# Patient Record
Sex: Female | Born: 1975
Health system: Southern US, Community
[De-identification: ages and names within clinical notes are randomized; demographics above are authoritative.]

## PROBLEM LIST (undated history)

## (undated) DIAGNOSIS — F419 Anxiety disorder, unspecified: Secondary | ICD-10-CM

## (undated) DIAGNOSIS — F988 Other specified behavioral and emotional disorders with onset usually occurring in childhood and adolescence: Secondary | ICD-10-CM

## (undated) DIAGNOSIS — A64 Unspecified sexually transmitted disease: Secondary | ICD-10-CM

## (undated) DIAGNOSIS — R7611 Nonspecific reaction to tuberculin skin test without active tuberculosis: Secondary | ICD-10-CM

## (undated) DIAGNOSIS — E7800 Pure hypercholesterolemia, unspecified: Secondary | ICD-10-CM

## (undated) DIAGNOSIS — E559 Vitamin D deficiency, unspecified: Secondary | ICD-10-CM

## (undated) HISTORY — DX: Anxiety disorder, unspecified: F41.9

## (undated) HISTORY — PX: OTHER SURGICAL HISTORY: SHX169

## (undated) HISTORY — DX: Other specified behavioral and emotional disorders with onset usually occurring in childhood and adolescence: F98.8

## (undated) HISTORY — PX: LIPOSUCTION: SHX10

## (undated) HISTORY — DX: Pure hypercholesterolemia, unspecified: E78.00

## (undated) HISTORY — DX: Unspecified sexually transmitted disease: A64

## (undated) HISTORY — DX: Vitamin D deficiency, unspecified: E55.9

## (undated) HISTORY — DX: Nonspecific reaction to tuberculin skin test without active tuberculosis: R76.11

---

## 2008-07-19 ENCOUNTER — Ambulatory Visit: Payer: Self-pay | Admitting: Internal Medicine

## 2008-07-19 ENCOUNTER — Ambulatory Visit: Payer: Self-pay | Admitting: Pulmonary Disease

## 2008-08-15 ENCOUNTER — Ambulatory Visit: Payer: Self-pay | Admitting: Internal Medicine

## 2008-09-14 ENCOUNTER — Ambulatory Visit: Payer: Self-pay | Admitting: Adult Health

## 2008-11-19 ENCOUNTER — Telehealth: Payer: Self-pay | Admitting: Pulmonary Disease

## 2009-09-19 ENCOUNTER — Telehealth: Payer: Self-pay | Admitting: Pulmonary Disease

## 2009-12-01 ENCOUNTER — Ambulatory Visit: Payer: Self-pay | Admitting: Internal Medicine

## 2009-12-01 DIAGNOSIS — S6710XA Crushing injury of unspecified finger(s), initial encounter: Secondary | ICD-10-CM | POA: Insufficient documentation

## 2010-06-12 ENCOUNTER — Encounter: Payer: Self-pay | Admitting: Adult Health

## 2010-07-23 ENCOUNTER — Encounter: Payer: Self-pay | Admitting: Adult Health

## 2010-08-17 ENCOUNTER — Telehealth: Payer: Self-pay | Admitting: Pulmonary Disease

## 2010-11-13 NOTE — Assessment & Plan Note (Signed)
Summary: NEW PT/ AS PER DR/RCD   Vital Signs:  Patient profile:   35 year old female Height:      67.5 inches Weight:      176 pounds BMI:     27.26 Temp:     98.1 degrees F oral BP sitting:   98 / 82  (left arm) Cuff size:   regular  Vitals Entered By: Alfred Levins, CMA (December 01, 2009 3:33 PM) CC: smashed rt index finger in car door   History of Present Illness: Jackie Davis    come  sin today for   as an acute work in  first time visit for an acute injury that  happened this pm when smashed  tip of left  index finger in car door. Hurt very badly and she was with her young child .  Pain is a bit better now.   No previous injury . can move finger but feels different.  no oozing  .Marland Kitchen    On no meds   or blood thinners.  just vitamins.   Preventive Screening-Counseling & Management  Alcohol-Tobacco     Alcohol drinks/day: 0     Smoking Status: quit > 6 months  Caffeine-Diet-Exercise     Does Patient Exercise: yes      Drug Use:  no.    Current Medications (verified): 1)  None  Allergies (verified): No Known Drug Allergies  Past History:  Past Medical History: ADD- controlled on rx Positive PPD Childbirth   Past Surgical History: Liposuction  Family History: diabetes and heart disease in father mother is healthy 1 healthy sibling Family History Diabetes 1st degree relative Family History Hypertension  Social History: married  hhof  3  1 child former smoker Alcohol use-no Drug use-no Regular exercise-yes Masters education  Spouse Dr Marchelle Gearing.   Drug Use:  no Does Patient Exercise:  yes Smoking Status:  quit > 6 months  Review of Systems  The patient denies anorexia, fever, and abnormal bleeding.    Physical Exam  General:  Well-developed,well-nourished,in no acute distress; alert,appropriate and cooperative throughout examination Head:  normocephalic.   Eyes:  vision grossly intact.   Msk:  left index finger with redness and  tenderness at radial nail bed   but nl cap refill and no nail elevation.  finger pul is not tense or deformed .   dip jt slighlty tender but mobile.    NV seems intact  sug ungual hematoma about 1/4 to 1/3 of nail  Pulses:  nl cap refill  Extremities:  nl perfusion Neurologic:  nl gait an station Skin:  turgor normal and color normal.   except see finger exam. Psych:  Oriented X3 and good eye contact.     Impression & Recommendations:  Problem # 1:  CRUSHING INJURY, FINGER (ICD-927.3) index     small hematoma   not large enough   that evacuation would help.   R/O tuft fracture  ( prob not but is  late friday  before weekend so check this today.    Elevation cold and pain control.    if increasing swelling or bleeding  consider recheck sat clinic  .   Orders: T-Finger(s) (73140TC)  Complete Medication List: 1)  Ibuprofen 800 Mg Tabs (Ibuprofen) .Marland Kitchen.. 1 by mouth q 6-8 hours  as needed pain.  Patient Instructions: 1)  get x ray to r/o tuft fracture    2)  otherwise  cold compresses and elevation. 3)  if increasing  swelling  and bleeding  may need to be seen in sat clinic.   Prescriptions: IBUPROFEN 800 MG TABS (IBUPROFEN) 1 by mouth q 6-8 hours  as needed pain.  #40 x 1   Entered and Authorized by:   Madelin Headings MD   Signed by:   Madelin Headings MD on 12/01/2009   Method used:   Electronically to        [facility]* (retail)       [address]       [address] Shipping/mailing       [place], [place]  [address]       Ph: [phone]       Fax: [phone]   RxID:   [id number]  x ray negative and notified pt of this  Madelin Headings MD  December 01, 2009 5:47 PM   Because of  lateness of the hour did not address  tetanus immunization status.  Injury is closed type.

## 2010-11-13 NOTE — Letter (Signed)
Summary: [facility]  [address]   [place], [place] [address]   Phone: [phone]  Fax: [phone]    06/12/2010  Jackie Davis [address] [place], [place]  [address]  Dear Ms. [name],           Sincerely,   Rubye Oaks NP

## 2010-11-13 NOTE — Letter (Signed)
Summary: [facility]  [address]   [place], [place] [address]   Phone: [phone]  Fax: [phone]    06/12/2010  Jackie Davis [address] [place], [place]  [address]  To whom it may concern:   Regarding the above patient, Jackie Davis, is my patient at [facility].  She is having prolonged difficulty with knee/joint pain. Prolonged recovery is limiting her ability to exercise. She will be unable to exercise for an undertermined amount of time.          Sincerely,   Rubye Oaks NP [facility]

## 2010-11-13 NOTE — Progress Notes (Signed)
Summary: sore throat-give doxycycline  Phone Note Call from Patient   Caller: Spouse Call For: [place] Summary of Call: Pt c/o Sore throat,pharyngitis, fever x few days. Pt has responded to doxycycline well in the past.  per Sn ok to prescribe doxycycline 100mg  twice daily x 5 days with 3 refills. Pt husband is a physician and monitoring closely.  send to [facility] [phone].   rx sent. pt aware.  Initial call taken by: Carron Curie CMA,  August 17, 2010 4:27 PM    New/Updated Medications: DOXYCYCLINE HYCLATE 100 MG TABS (DOXYCYCLINE HYCLATE) Take 1 tablet by mouth two times a day x5 days Prescriptions: DOXYCYCLINE HYCLATE 100 MG TABS (DOXYCYCLINE HYCLATE) Take 1 tablet by mouth two times a day x5 days  #10 x 3   Entered by:   Carron Curie CMA   Authorized by:   Michele Mcalpine MD   Signed by:   Carron Curie CMA on 08/17/2010   Method used:   Historical   RxID:   [id number]

## 2010-11-15 NOTE — Letter (Signed)
Summary: [facility]  [facility]   Imported By: Lennie Odor 10/30/2010 12:04:31  _____________________________________________________________________  External Attachment:    Type:   Image     Comment:   External Document

## 2011-03-01 ENCOUNTER — Encounter: Payer: Self-pay | Admitting: Internal Medicine

## 2011-03-01 ENCOUNTER — Institutional Professional Consult (permissible substitution): Payer: Self-pay | Admitting: Internal Medicine

## 2011-03-26 ENCOUNTER — Institutional Professional Consult (permissible substitution): Payer: Self-pay | Admitting: Internal Medicine

## 2013-02-12 ENCOUNTER — Ambulatory Visit (INDEPENDENT_AMBULATORY_CARE_PROVIDER_SITE_OTHER): Payer: Self-pay

## 2013-02-12 DIAGNOSIS — R0609 Other forms of dyspnea: Secondary | ICD-10-CM

## 2013-02-12 DIAGNOSIS — R0600 Dyspnea, unspecified: Secondary | ICD-10-CM

## 2013-02-12 NOTE — Progress Notes (Signed)
Spiro done.

## 2013-02-15 ENCOUNTER — Ambulatory Visit: Payer: Self-pay

## 2013-11-18 ENCOUNTER — Telehealth: Payer: Self-pay | Admitting: Internal Medicine

## 2013-11-18 NOTE — Telephone Encounter (Signed)
Dr Colletta Marylandamaswami, Absolutely! Can she come for a 3 pm appt on 02/10? If not, on 02/12 at 4:15 pm? If not, we can schedule her later, too. I can add her to the schedule. Please let me know.  Silvestre Mesiristina

## 2013-11-18 NOTE — Telephone Encounter (Signed)
Gardenia PhlegmHi Wandra,  Our daughter is at school. Her ideal time is 9am and ending by 2.30pm. First available is fine. However, if you only have PM slots is it ok if our 7 year ild daughter comes along?  Thanks  Arielis Leonhart  Dr. Kalman ShanMurali Martia Dalby, M.D., F.C.C.P Pulmonary and Critical Care Medicine Staff Physician [facility] [place] [facility] and Critical Care Pager: [phone], If no answer or between  15:00h - 7:00h: call [phone]  11/18/2013 7:30 PM

## 2013-11-18 NOTE — Telephone Encounter (Signed)
Dear DR Gherghe  My wife wants a consultation with you regardiElvera Lennoxng weight management. How may she proceed to get an appointment? She has [facility] and I do not think she needs a referral  Thanks   Dr. Kalman ShanMurali Cecil Vandyke, M.D., [facility] CenterF.C.C.P Pulmonary and Critical Care Medicine Staff Physician [facility] [place] Pulmonary and Critical Care Pager: [phone], If no answer or between  15:00h - 7:00h: call [phone]  11/18/2013 2:47 PM

## 2013-11-19 NOTE — Telephone Encounter (Signed)
Dear Carmin MuskratMurali, We can do 11:15 am on 02/16. That would be first available in am. However, she can definitely bring your daughter along if she decides to stick with the pm appt. Silvestre Mesiristina

## 2013-11-22 NOTE — Telephone Encounter (Signed)
Thanks a lot 

## 2013-11-22 NOTE — Telephone Encounter (Signed)
Dear Jackie Davis, We put your wife on the list for next Monday at 11:15 am. Please tell her to come at or a little before 11 am. Thank you, Silvestre Mesiristina

## 2013-11-22 NOTE — Telephone Encounter (Signed)
Thanks Jackie MesiCristina  My wife Thompson GrayerCristina Ruas de Melo  Can come 11.15 am on 11/29/13. Please let us know location. I will have her come 15 minutes early  Thanks  Dr. Kalman ShanMurali Joliana Davis, M.D., [facility] Hospital[facility].C.C.P Pulmonary and Critical Care Medicine Staff Physician [facility] [place] Pulmonary and Critical Care Pager: [phone], If no answer or between  15:00h - 7:00h: call [phone]  11/22/2013 10:22 AM

## 2013-11-29 ENCOUNTER — Ambulatory Visit (INDEPENDENT_AMBULATORY_CARE_PROVIDER_SITE_OTHER): Payer: 59 | Admitting: Internal Medicine

## 2013-11-29 ENCOUNTER — Encounter: Payer: Self-pay | Admitting: Internal Medicine

## 2013-11-29 VITALS — BP 102/68 | HR 80 | Temp 98.0°F | Resp 12 | Ht 68.0 in | Wt 197.0 lb

## 2013-11-29 DIAGNOSIS — R635 Abnormal weight gain: Secondary | ICD-10-CM | POA: Insufficient documentation

## 2013-11-29 NOTE — Patient Instructions (Signed)
Please consider the following ways to cut down carbs and fat and increase fiber and micronutrients in your diet:  - substitute whole grain for white bread or pasta - substitute brown rice for white rice - substitute 90-calorie flat bread pieces for slices of bread when possible - substitute sweet potatoes or yams for white potatoes - substitute humus for margarine - substitute tofu for cheese when possible - substitute almond or rice milk for regular milk (would not drink soy milk daily due to concern for soy estrogen influence on breast cancer risk) - substitute dark chocolate for other sweets when possible - substitute water - can add lemon or orange slices for taste - for diet sodas (artificial sweeteners will trick your body that you can eat sweets without getting calories and will lead you to overeating and weight gain in the long run) - do not skip breakfast or other meals (this will slow down the metabolism and will result in more weight gain over time)  - can try smoothies made from fruit and almond/rice milk in am instead of regular breakfast - can also try old-fashioned (not instant) oatmeal made with almond/rice milk in am - order the dressing on the side when eating salad at a restaurant (pour less than half of the dressing on the salad) - eat as little meat as possible - can try juicing, but should not forget that juicing will get rid of the fiber, so would alternate with eating raw veg./fruits or drinking smoothies - use as little oil as possible, even when using olive oil - can dress a salad with a mix of balsamic vinegar and lemon juice, for e.g. - use agave nectar, stevia sugar, or regular sugar rather than artificial sweateners - steam or broil/roast veggies  - snack on veggies/fruit/nuts (unsalted, preferably) when possible, rather than processed foods - reduce or eliminate aspartame in diet (it is in diet sodas, chewing gum, etc) Read the labels!  Try to read Dr. Katherina RightNeal  Barnard's book: "Program for Reversing Diabetes" for the vegan concept and other ideas for healthy eating.  Try to replace snacking on these with drinking/eating: * soy or almond milk * veggies with humus or other low calorie/low fat dip * low glycemic index fruits (higher glycemic index = higher risk to increase your sugars):          http://www.health.https://www.brown.info/harvard.edu/newsweek/Glycemic_index_and_glycemic_load_for_100_foods.htm * fruit/veggie smoothies          Ninja blender recipes:          CultureParks.com.eehttp://www.ninjakitchen.com/recipes/search/14/smoothies-and-juices/ * unsalted nuts Etc.  Plant-based diet materials:  - Lectures (you tube):  Lequita AsalNeal Barnard: "Breaking the Food Seduction"  Doug Lisle: "How to Lose Weight, without Losing Your Mind"  Lucile CraterRodney Snow: "What is Insulin Resistance" TucsonEntrepreneur.sihttp://www.youtube.com/watch?v=k5iM6A67z3Y - Documentaries:  Supersize Me  Food Inc.  Forks over BorgWarnerKnives  Vegucated  Fat, Sick and Nearly Dead  The Edison InternationalWeight of the Nationwide Mutual Insuranceation - Books:  Lequita AsalNeal Barnard: "Program for Reversing Diabetes"  Ferol LuzColin Campbell: "The Armeniahina Study"  Konrad PentaDonna Klein: "Supermarket Vegan" (cookbook) - Facebook pages:   Reece AgarForks versus Knives  Vegucated  Toys ''R'' UsVegNews Magazine  Food Matters - Healthy nutrition info websites:  LateTelevision.com.eehttp://nutritionfacts.org/

## 2013-11-29 NOTE — Progress Notes (Signed)
Patient ID: Jackie Davis, female   DOB: September 02, 1976, 38 y.o.   MRN: [id number]  HPI: Jackie Davis is a 38 y.o.-year-old [place] female, self-referred, for evaluation for endocrine causes for weight gain. PCP: Dr Pearson GrippeJames Kim ([facility])   Pt tells me that she had weight problems since ~38 y/o. [place] came [place]. She remembers that 5 mo after her pregnancy (daughter is 347 y/o), she weighed 162 lbs, but then slowly and steadily increased to 197 lbs.   She tried Phentermine/Topiramate (Qsmia), Phentermine by itself, and also another med (cannot remember name). She developed anxiety with Phentermine >> panic attacks. She also had HA with the combination med.   She has anxiety and started Citalopram 2 years ago and Escitalopram recently.   Patient c/o: - + weight gain - + fatigue - + hair loss - believes this is from blowdrying and  - + regular menstrual cycles  Pt's meals are: - Breakfast: oatmeal + banana - Lunch: salad or sandwich (tried to stop all bread) - Dinner: meat + vegetables - Snacks: at least 3: tries fruit for the last 2 weeks >> but before would eat concentrated sweets She drank diet sodas before, now less, mostly diet.   Exercise: - daily >> elliptical, pilates, walks  She stopped working in 09/2014, now has more time to exercise.  - No h/o DM. Last hemoglobin A1c was: normal, per pt report  - No h/o hypothyroidism. Last thyroid tests: normal, per pt report  - She has high cholesterol. No labs available for review.  Pt has FH of obesity in MGM and of DM in father. She had almost GDM.   ROS: Constitutional: +weight gain, + increased appetite, + fatigue, no subjective hyperthermia/hypothermia Eyes: no blurry vision, no xerophthalmia ENT: no sore throat, no nodules palpated in throat, no dysphagia/odynophagia, no hoarseness Cardiovascular: no CP/SOB/palpitations/leg swelling Respiratory: no cough/SOB Gastrointestinal: no  N/V/D/C Musculoskeletal: no muscle/joint aches Skin: no rashes Neurological: no tremors/numbness/tingling/dizziness Psychiatric: no depression/+ anxiety  Past Medical History  Diagnosis Date  . ADD (attention deficit disorder)   . Positive PPD   . Anxiety    Past Surgical History  Procedure Laterality Date  . Liposuction     History   Social History  . Marital Status: Married    Spouse Name: N/A    Number of Children: 1   Occupational History  . 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 Smoking status: Never Smoker   . Smokeless tobacco: Not on file  . Alcohol Use: Occasionally: wine, beer  . Drug Use: No   Social History Narrative   Spouse of Dr. Marchelle Gearingamaswamy   Current Outpatient Rx  Name  Route  Sig  Dispense  Refill  . ALPRAZolam (XANAX) 0.25 MG tablet   Oral   Take 0.25 mg by mouth as needed for anxiety.         Marland Kitchen. escitalopram (LEXAPRO) 20 MG tablet   Oral   Take 20 mg by mouth daily.          NKDA  Family History  Problem Relation Age of Onset  . Diabetes Father   . Heart disease Father   . Healthy Mother   . Healthy Other     Sibling  . Hypertension Other    PE: BP 102/68  Pulse 80  Temp(Src) 98 F (36.7 C) (Oral)  Resp 12  Ht 5'  8" (1.727 m)  Wt 197 lb (89.359 kg)  BMI 29.96 kg/m2  SpO2 98% Wt Readings from Last 3 Encounters:  11/29/13 197 lb (89.359 kg)  12/01/09 176 lb (79.833 kg)  07/19/08 165 lb 6.1 oz (75.016 kg)   Constitutional: overweight - gynoid distribution, in NAD Eyes: PERRLA, EOMI, no exophthalmos ENT: moist mucous membranes, no thyromegaly, no cervical lymphadenopathy Cardiovascular: RRR, No MRG Respiratory: CTA B Gastrointestinal: abdomen soft, NT, ND, BS+ Musculoskeletal: no deformities, strength intact in all 4 Skin: moist, warm, no rashes Neurological: no tremor with outstretched hands, DTR normal in all 4  ASSESSMENT: 1. Weight gain - slow, over years - overweight  (BMI 29.9) BMI Classification:  < 18.5 underweight   18.5-24.9 normal weight   25.0-29.9 overweight   30.0-34.9 class I obesity   35.0-39.9 class II obesity   ? 40.0 class III obesity   PLAN: 1. Overweight - Discussed diet in detail, reviewing each of her food choices and advised for alternatives (also given a list of healthy substitutions, the patient instructions section). Given specific examples about healthy breakfast, lunch, dinner and snack choices. - Advised to stop drinking sodas of any kind. Substitute with fresh fruit-flavored water. - Advised to eat lower glycemic index foods. Avoid highly-processed sugars.  - Discussed at length about the benefits of a diet with less meat, with probably the best being a vegan one, but the Mediterranean diet being the next best one. Given instructions about materials to use for the plant-based diet (please see patient instructions) - Discussed about benefits and side effects of weight loss medicines. She is not interested in retrying them. I advised her that until she can change the way she thinks about food and make this a way of life rather than a diet, it would be very difficult to lose weight and especially maintain it down - Discussed about exercise but advised her to start with the diet - she does not appear to have endocrine causes for obesity:   hypothyroidism (thyroid test were normal recently)  Cushing disease no sxs or signs  menopause (regular menstrual cycles)  PCOS (regular menstrual cycles) - no signs or sxs of DM and she apparently had a normal HbA1c recently HbA1c - I will see the patient back in 6 months for recheck, but I encouraged her to join MyChart and let the know about her progress with the diet or any questions that she might have.

## 2014-01-24 ENCOUNTER — Telehealth: Payer: Self-pay | Admitting: Internal Medicine

## 2014-01-24 NOTE — Telephone Encounter (Signed)
Jackie Davis  This Tiburcio PeaCristina Ruas de Soddy-DaisyMelo , [birth year] is my wife. She has knee and back pain. Back pain on and off for 18 months  Or so with intervals between episodes getting more frequent. COuld you please see her  asap  Her number is [phone]  Thanks  Dr. Kalman ShanMurali Aqueelah Cotrell, M.D., [facility].C.C.P Pulmonary and Critical Care Medicine Staff Physician [facility] [facility] [place] [facility]monary and Critical Care Pager: [phone], If no answer or between  15:00h - 7:00h: call [phone]  01/24/2014 6:06 PM

## 2014-01-25 NOTE — Telephone Encounter (Signed)
Happy to [facility], i [place], have her see me sometime next week.  Tell them i said ok to book her whenever.

## 2014-02-09 ENCOUNTER — Encounter: Payer: Self-pay | Admitting: Family Medicine

## 2014-02-09 ENCOUNTER — Ambulatory Visit (INDEPENDENT_AMBULATORY_CARE_PROVIDER_SITE_OTHER): Payer: 59 | Admitting: Family Medicine

## 2014-02-09 ENCOUNTER — Ambulatory Visit (HOSPITAL_BASED_OUTPATIENT_CLINIC_OR_DEPARTMENT_OTHER)
Admission: RE | Admit: 2014-02-09 | Discharge: 2014-02-09 | Disposition: A | Discharge status: Home / Self Care | Payer: 59 | Source: Ambulatory Visit | Attending: Family Medicine | Admitting: Family Medicine

## 2014-02-09 VITALS — BP 117/76 | HR 71 | Ht 68.0 in | Wt 195.0 lb

## 2014-02-09 DIAGNOSIS — M25561 Pain in right knee: Secondary | ICD-10-CM

## 2014-02-09 DIAGNOSIS — M25569 Pain in unspecified knee: Secondary | ICD-10-CM | POA: Insufficient documentation

## 2014-02-09 NOTE — Patient Instructions (Signed)
Your history and exam are most consistent with patellofemoral syndrome, less likely arthritis and degenerative meniscal tear. Get x-rays as you leave today to assess for arthritis - we will call you with results. Avoid painful activities (especially squats and lunges, plyometrics, increasing running mileage) Cross train with swimming, cycling with low resistance, elliptical while doing rehab exercises. Straight leg raise, straight leg raises with foot turned outwards, hip side raises 3 sets of 10 once daily. Add ankle weight if these become too easy. Consider formal physical therapy Correct foot breakdown or cavus feet with something like Dr. Jari SportsmanScholls active series insoles - wear when up and walking around. Icing 15 minutes at a time 3-4 times a day if needed. Tylenol or aleve as needed for pain as needed. Follow up with me in 5-6 weeks.

## 2014-02-10 ENCOUNTER — Encounter: Payer: Self-pay | Admitting: Family Medicine

## 2014-02-10 DIAGNOSIS — M25561 Pain in right knee: Secondary | ICD-10-CM | POA: Insufficient documentation

## 2014-02-10 NOTE — Assessment & Plan Note (Signed)
radiographs negative for arthritis, other bony abnormality.  Most likely due to patellofemoral syndrome.  Start with home exercise program which was reviewed today.  Better arch supports also.  Icing as needed.  Tylenol or aleve as needed.  F/u in 5-6 weeks.

## 2014-02-10 NOTE — Progress Notes (Signed)
Patient ID: Jackie Davis, female   DOB: 07/02/1976, 38 y.o.   MRN: [id number]  PCP: Lorretta HarpPANOSH,WANDA KOTVAN, MD  Subjective:   HPI: Patient is a 38 y.o. female here for right knee pain.  Patient reports she's had right knee pain for about 2 months. It has been more constant over that time though does state had off and on pain prior to this. Difficulty running, jumping. Rare catching sensation of the knee. No known injury or trauma. No swelling or bruising. Worse with exercise. No giving out. Has not tried anything to date for this.  Past Medical History  Diagnosis Date  . ADD (attention deficit disorder)   . Positive PPD   . Anxiety   . Vitamin D deficiency     Current Outpatient Prescriptions on File Prior to Visit  Medication Sig Dispense Refill  . ALPRAZolam (XANAX) 0.25 MG tablet Take 0.25 mg by mouth as needed for anxiety.      Marland Kitchen. escitalopram (LEXAPRO) 20 MG tablet Take 20 mg by mouth daily.       No current facility-administered medications on file prior to visit.    Past Surgical History  Procedure Laterality Date  . Liposuction      No Known Allergies  History   Social History  . Marital Status: Married    Spouse Name: N/A    Number of Children: 1  . Years of Education: N/A   Occupational History  . masters education    Social History Main Topics  . Smoking status: Never Smoker   . Smokeless tobacco: Not on file  . Alcohol Use: No  . Drug Use: No  . Sexual Activity: Not on file   Other Topics Concern  . Not on file   Social History Narrative   Spouse of Dr. Marchelle Gearingamaswamy    Family History  Problem Relation Age of Onset  . Diabetes Father   . Heart disease Father   . Healthy Mother   . Healthy Other     Sibling  . Hypertension Other     BP 117/76  Pulse 71  Ht 5\' 8"  (1.727 m)  Wt 195 lb (88.451 kg)  BMI 29.66 kg/m2  Review of Systems: See HPI above.    Objective:  Physical Exam:  Gen: NAD  Right knee: No gross  deformity, ecchymoses, effusion.  VMO atrophy Mild discomfort post patellar facets, less at joint lines.   FROM. Negative ant/post drawers.  Negative valgus/varus testing.  Negative lachmanns. Negative mcmurrays, apleys, patellar apprehension. Mild overpronation with wide forefeet. Hip abduction strength 5/5. NV intact distally.    Assessment & Plan:  1. Right knee pain - radiographs negative for arthritis, other bony abnormality.  Most likely due to patellofemoral syndrome.  Start with home exercise program which was reviewed today.  Better arch supports also.  Icing as needed.  Tylenol or aleve as needed.  F/u in 5-6 weeks.

## 2014-05-16 ENCOUNTER — Ambulatory Visit: Payer: 59 | Admitting: Family Medicine

## 2014-05-18 ENCOUNTER — Encounter: Payer: Self-pay | Admitting: Family Medicine

## 2014-05-18 ENCOUNTER — Ambulatory Visit (INDEPENDENT_AMBULATORY_CARE_PROVIDER_SITE_OTHER): Payer: 59 | Admitting: Family Medicine

## 2014-05-18 VITALS — BP 115/72 | HR 86 | Ht 68.0 in | Wt 192.0 lb

## 2014-05-18 DIAGNOSIS — M25561 Pain in right knee: Secondary | ICD-10-CM

## 2014-05-18 DIAGNOSIS — M25569 Pain in unspecified knee: Secondary | ICD-10-CM

## 2014-05-18 MED ORDER — METHYLPREDNISOLONE ACETATE 40 MG/ML IJ SUSP
40.0000 mg | Freq: Once | INTRAMUSCULAR | Status: AC
  Administered 2014-05-18: 40 mg via INTRA_ARTICULAR

## 2014-05-18 NOTE — Patient Instructions (Signed)
You have pes bursitis. Ice the knee 15 minutes at a time 3-4 times a day. Ibuprofen 600mg  three times a day with food OR aleve 2 tabs twice a day with food for pain and inflammation. You were given a cortisone shot for this today. Start the home exercises I showed you (hamstring curls, straight leg raises with foot turned outwards, and the standing hacky-sack exercise where you raise your foot off the ground inwards) - work your way up to 3 sets of 10 once a day. Consider physical therapy if not improving. Follow up with me in 1 month or as needed.

## 2014-05-19 ENCOUNTER — Encounter: Payer: Self-pay | Admitting: Family Medicine

## 2014-05-19 NOTE — Assessment & Plan Note (Signed)
2/2 pes bursitis.  Icing, reviewed home exercise program, regular nsaids over the next 7-10 days.  Cortisone injection given today also.  Consider physical therapy if not improving.  F/u in 1 month or prn.  After informed written consent patient was seated on exam table.  Area overlying right pes bursa prepped with alcohol swab then injected with 3:1 marcaine: depomedrol.  Patient tolerated procedure well without immediate complications.

## 2014-05-19 NOTE — Progress Notes (Signed)
Patient ID: Jackie Davis, female   DOB: Aug 06, 1976, 38 y.o.   MRN: [id number]  PCP: Lorretta HarpPANOSH,WANDA KOTVAN, MD  Subjective:   HPI: Patient is a 38 y.o. female here for right knee pain.  4/29: Patient reports she's had right knee pain for about 2 months. It has been more constant over that time though does state had off and on pain prior to this. Difficulty running, jumping. Rare catching sensation of the knee. No known injury or trauma. No swelling or bruising. Worse with exercise. No giving out. Has not tried anything to date for this.  8/5: Patient reports she improved after last visit. Has been doing a more extensive workout program for weight loss. Past 4 days has had medial right knee pain with swelling. No catching, locking, giving out. Doing home exercises. Not taking any medicines. Has been icing and using arch supports.  Past Medical History  Diagnosis Date  . ADD (attention deficit disorder)   . Positive PPD   . Anxiety   . Vitamin D deficiency     Current Outpatient Prescriptions on File Prior to Visit  Medication Sig Dispense Refill  . ALPRAZolam (XANAX) 0.25 MG tablet Take 0.25 mg by mouth as needed for anxiety.      Marland Kitchen. escitalopram (LEXAPRO) 20 MG tablet Take 20 mg by mouth daily.       No current facility-administered medications on file prior to visit.    Past Surgical History  Procedure Laterality Date  . Liposuction      No Known Allergies  History   Social History  . Marital Status: Married    Spouse Name: N/A    Number of Children: 1  . Years of Education: N/A   Occupational History  . masters education    Social History Main Topics  . Smoking status: Never Smoker   . Smokeless tobacco: Not on file  . Alcohol Use: No  . Drug Use: No  . Sexual Activity: Not on file   Other Topics Concern  . Not on file   Social History Narrative   Spouse of Dr. Marchelle Gearingamaswamy    Family History  Problem Relation Age of Onset  . Diabetes  Father   . Heart disease Father   . Healthy Mother   . Healthy Other     Sibling  . Hypertension Other     BP 115/72  Pulse 86  Ht 5\' 8"  (1.727 m)  Wt 192 lb (87.091 kg)  BMI 29.20 kg/m2  Review of Systems: See HPI above.    Objective:  Physical Exam:  Gen: NAD  Right knee: No gross deformity, ecchymoses, effusion.  Mild swelling pes bursa area. No joint line, post patellar facet tenderness. FROM. Pain with resisted sartorius testing.  5/5 strength no pain otherwise. Negative ant/post drawers.  Negative valgus/varus testing.  Negative lachmanns. Negative mcmurrays, apleys, patellar apprehension. NV intact distally.    Assessment & Plan:  1. Right knee pain - 2/2 pes bursitis.  Icing, reviewed home exercise program, regular nsaids over the next 7-10 days.  Cortisone injection given today also.  Consider physical therapy if not improving.  F/u in 1 month or prn.  After informed written consent patient was seated on exam table.  Area overlying right pes bursa prepped with alcohol swab then injected with 3:1 marcaine: depomedrol.  Patient tolerated procedure well without immediate complications.

## 2014-05-30 ENCOUNTER — Ambulatory Visit: Payer: 59 | Admitting: Internal Medicine

## 2014-06-23 ENCOUNTER — Encounter: Payer: Self-pay | Admitting: Internal Medicine

## 2014-06-23 ENCOUNTER — Ambulatory Visit (INDEPENDENT_AMBULATORY_CARE_PROVIDER_SITE_OTHER): Payer: 59 | Admitting: Internal Medicine

## 2014-06-23 VITALS — BP 110/66 | HR 73 | Temp 98.0°F | Resp 12 | Wt 190.0 lb

## 2014-06-23 DIAGNOSIS — R635 Abnormal weight gain: Secondary | ICD-10-CM

## 2014-06-23 DIAGNOSIS — E282 Polycystic ovarian syndrome: Secondary | ICD-10-CM | POA: Insufficient documentation

## 2014-06-23 NOTE — Patient Instructions (Signed)
Please come back as needed.

## 2014-06-23 NOTE — Progress Notes (Signed)
Patient ID: Jackie Davis, female   DOB: 1976-01-27, 38 y.o.   MRN: [id number]  HPI: Jackie Davis is a 38 y.o.-year-old [place] female, returning for f/u for weight gain.   Reviewed hx: Pt tells me that she had weight problems since ~38 y/o. She came to [place] ~ 27 years ago. She remembers that 5 mo after her pregnancy (daughter is 63 y/o), she weighed 162 lbs, but then slowly and steadily increased to 197 lbs.   She tried Phentermine/Topiramate (Qsmia), Phentermine by itself, and also another med (cannot remember name). She developed anxiety with Phentermine >> panic attacks. She also had HA with the combination med.   She has anxiety and started Citalopram 2.5 years ago and Escitalopram recently.   At last visit, we discussed about the benefits of the feet and diet for both weight loss and other health benefits, however she could not follow this. Since last visit, she gained 15 lbs >> then lost 10 lbs and then went to [facility] >> lost 10 lbs in 2 weeks (low-carb diet and intense exercise program closer, then lost 5 more (gained the 5 lbs back since then). She exercised a lot at that time.  She forgot to mention this at last visit, but she was dx with PCOS in [place] when she was 38 years old >> started Metformin 500 mg 2 weeks ago.  Patient mentions: - + weight loss - no fatigue - + regular menstrual cycles, but they were irregular for 10 years, until 38 y/o.   Pt's meals are: - Breakfast: oatmeal + banana - Lunch: salad or sandwich (tried to stop all bread) - Dinner: meat + vegetables - Snacks: trying to eliminate them  Exercise: - daily >> elliptical, spin, pilates, walks  She stopped working in 09/2013, now has more time to exercise.  - No h/o DM. Last hemoglobin A1c was: normal, per pt report - No h/o hypothyroidism. Last thyroid tests: normal, per pt report  I reviewed pt's medications, allergies, PMH, social hx, family hx and no changes required,  except as mentioned above: starting metformin 500 mg bid  ROS: Constitutional: + weight gain and loss, no fatigue, no subjective hyperthermia/hypothermia Eyes: no blurry vision, no xerophthalmia ENT: no sore throat, no nodules palpated in throat, no dysphagia/odynophagia, no hoarseness Cardiovascular: no CP/SOB/palpitations/leg swelling Respiratory: no cough/SOB Gastrointestinal: no N/V/D/C Musculoskeletal: no muscle/joint aches Skin: no rashes Neurological: no tremors/numbness/tingling/dizziness  PE: BP 110/66  Pulse 73  Temp(Src) 98 F (36.7 C) (Oral)  Resp 12  Wt 190 lb (86.183 kg)  SpO2 97% Body mass index is 28.9 kg/(m^2).  Wt Readings from Last 3 Encounters:  06/23/14 190 lb (86.183 kg)  05/18/14 192 lb (87.091 kg)  02/09/14 195 lb (88.451 kg)   Constitutional: overweight - gynoid distribution, in NAD Eyes: PERRLA, EOMI, no exophthalmos ENT: moist mucous membranes, no thyromegaly, no cervical lymphadenopathy Cardiovascular: RRR, No MRG Respiratory: CTA B Gastrointestinal: abdomen soft, NT, ND, BS+ Musculoskeletal: no deformities, strength intact in all 4 Skin: moist, warm, no rashes Neurological: no tremor with outstretched hands, DTR normal in all 4  ASSESSMENT: 1. Weight gain - slow, over years - overweight (BMI 28.9) BMI Classification:  < 18.5 underweight   18.5-24.9 normal weight   25.0-29.9 overweight   30.0-34.9 class I obesity   35.0-39.9 class II obesity   ? 40.0 class III obesity   2. PCOS  PLAN: 1. Overweight - pt doing great from her weight loss point  of view, she only lost overall 5 pounds since last visit, however, she actually lost a total of about 20 pounds overall - she is now exercising every day and is trying to follow a low-carb diet. She eliminated lactose from her diet and she noticed that she feels much better after this change - She also started metformin, and she remembers that she was diagnosed with PCOS in [place] when she  was 38. She did not remember to mention this at last visit, as her cycles were regular. However, she recently remembered about this, while at the [facility] in [facility] - she can tolerate metformin well, and I advised her that she can eat from 500 mg daily to 500 mg twice a day or even 3 times a day with meals - I advised her that I can refill the metformin for her in the future, or she can followup with OB/GYN for this - We will not schedule a followup appointment, but she will return on an as needed basis  2. PCOS - she was recently started on metformin 500 mg daily. She noticed that her hunger is much better say she started this medication. - I advised her that she can even increase metformin to 500 mg twice a day or 3 times a day if she can tolerate it well - I do not believe that her PCOS is active (nno acne, hirsutism, or irregular menses), however I believe that metformin can definitely help with her weight loss  - time spent with the patient: 30 min, of which >50% was spent in dietary counseling and also counseling about PCOS

## 2014-09-06 ENCOUNTER — Encounter: Payer: Self-pay | Admitting: Family Medicine

## 2014-09-06 ENCOUNTER — Ambulatory Visit (INDEPENDENT_AMBULATORY_CARE_PROVIDER_SITE_OTHER): Payer: 59 | Admitting: Family Medicine

## 2014-09-06 VITALS — Ht 68.0 in | Wt 194.5 lb

## 2014-09-06 DIAGNOSIS — R635 Abnormal weight gain: Secondary | ICD-10-CM

## 2014-09-06 NOTE — Patient Instructions (Addendum)
-   Check out all the therapists at [facility] website.  Also:     Phylliss BlakesGwen Auel,    AND   [place], [place], LPC; [phone]   [address]   [place], [place]  [id number]   [email or web address]  - Go to [email or web address]; look for June video, "Have some chips, but not the whole bag."  Email [email or web address] what you got out of the video.  Also tell me what you feel especially can be applied to you, and what you will specifically can do to better manage food choices.   - Nutrition guidelines 1. Eat at least REAL 3 meals and 1-2 snacks per day.  Aim for no more than 5 hours between eating.  Eat breakfast within one hour of getting up.   - REAL meal:  Would you serve this to a guest in your home, and call it a meal?  - A REAL meal includes a source of protein, a starch, and veg's and/or fruit.    - A REAL meal looks like, tastes like, and FEELS like a meal.  2. Breakfast:  Create a list of breakfast options, and ways in which you can make those options as easy as possible.  Email your list.   3. When you are struggling with a food decision, ask yourself these 3 Qs, and WRITE your answers:  1. How do I FEEL right now?  2. How do I WANT to feel?  3. What do I truly NEED now?    Bring your answers to follow-up appt to discuss.  If you haven't answered the questions in the moment, ask yourself retrospectively.    - WHEN YOU COME IN NEXT TIME, WE WILL REVIEW DEALING WITH EMOTIONAL EATING.

## 2014-09-06 NOTE — Progress Notes (Signed)
Medical Nutrition Therapy:  Appt start time: 1000 end time:  1100. PCP:   Dr. Pearson GrippeJames Kim Therapist:  Jerolyn ShinJessie Lombardi (but has discontinued b/c she has moved; [facility]) Other medical team members: none  Assessment:  Primary concerns today: eating disorder.   History of eating disorder:  Last July Jackie Davis went to [facility] where she lost some weight, some of which she has gained back.  She more recently called [facility], who gave her my name, encouraging her to see someone regularly on an out-pt basis.  It was at [facility] that she confirmed that she needs both therapy and nutrition guidance around her eating practices.  Trula OreChristina said she eats from anxiety and when she feels overwhelmed, which is fairly frequently.    Trula OreChristina lives with her husband and 6-YO daughter.  She moved here from [place] 10 yrs ago.  Her husband, who is from [place], works a lot, so she is often on her own in a culture that is very different from her home culture.  She used to work at [employer] full-time, but started to have panic attacks so frequently that she quit a yr ago.  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; avoids ALL etOH and mj now Internet use:    No   Food rules or rituals:   Night-time eating, before bed or sometimes after having gone to bed; feels guilty after Over-exercise:   Yes - in [place], but not in recent yrs Liposuction:    Age [age]s (in college); parents paid for it  Other Relevant History: Abuse:     No      Reflux:     No Other  GI complaints:    Abd pain after eating; unpredictable  24-hr recall: (Up at 9:30 AM (back to bed after bringing dtr to school) B (10 AM)-   1 c coffee, 2 pkts Spenda, <2 oz queso fresco, ~15 crackers Snk ( AM)-    L ( PM)-  --- Snk ( PM)-   D (7 PM)-  2 svng lasagne, diet Coke Snk (10 PM)-  2 cookies, water Typical day? No. More typical would include coffee, bread w/ butter or fruit or cereal for breakfast.  Usually has 3 meals and 2-3 snks.  Dinner is usually frozen meal to be able to track kcal.    Progress Towards Goal(s):  In progress.   Nutritional Diagnosis:  [place]-3.3 Overweight/obesity As related to energy imbalance.  As evidenced by BMI >29.    Intervention:  Nutrition counseling.  Learning Readiness: Ready  Handouts given during visit include:  AVS  Demonstrated degree of understanding via:  Teach Back   Monitoring/Evaluation:  Dietary intake, exercise, and body weight in 6 week(s).  No appts available sooner.

## 2014-10-12 ENCOUNTER — Encounter: Payer: Self-pay | Admitting: Family Medicine

## 2014-10-12 ENCOUNTER — Ambulatory Visit (INDEPENDENT_AMBULATORY_CARE_PROVIDER_SITE_OTHER): Payer: 59 | Admitting: Family Medicine

## 2014-10-12 VITALS — Ht 68.0 in | Wt 198.0 lb

## 2014-10-12 DIAGNOSIS — R635 Abnormal weight gain: Secondary | ICD-10-CM

## 2014-10-12 NOTE — Progress Notes (Signed)
Medical Nutrition Therapy:  Appt start time: 1000 end time:  1100. PCP:   Dr. Pearson GrippeJames Kim Therapist:  Jerolyn ShinJessie Lombardi (but has discontinued b/c she has moved; [facility]) Other medical team members: none  Assessment:  Primary concerns today: eating disorder.   History of eating disorder:  Silvestre MesiCristina misplaced her AVS printout, so forgot each of the recommendations given.  She has been busy with the holidays and with traveling, but admitted there is likely an element of not wanting to remember those recommendations.  She and her family went on a cruise  Dec 19-26, and she was challenged daily with an abundance of food.  She was disappointed in herself, often making choices she regretted.  She tended to swing between overeating and restrained eating.   Analyzed feelings from one time of eating during her cruise: FELT: Pre-eating: Joyous, happy / After eating: Disappointed, discouraged, regretful, giving up, resigned WANTED TO FEEL:  Happy, proud, balanced, self-control NEEDED:  Self-respect, self-acceptance, empathy, friendship, love, support, understanding, belonging  Did not do a 24-hr recall, as we spent the whole appt exploring the drivers of Saanvi's food choices.  There seems to be a combination of habit, mindless choices/eating, and some emotional eating happening.    Progress Towards Goal(s):  In progress.   Nutritional Diagnosis:  [place]-3.3 Overweight/obesity As related to energy imbalance.  As evidenced by BMI >29.    Intervention:  Nutrition counseling.  Learning Readiness: Ready  Handouts given during visit include:  AVS  Demonstrated degree of understanding via:  Teach Back   Monitoring/Evaluation:  Dietary intake, exercise, and body weight in 4 week(s).

## 2014-10-12 NOTE — Patient Instructions (Addendum)
-   Do your homework to find a psychiatrist, and get an appt set up.   Analyzed feelings from one time of eating during her cruise Dec 19-26: FELT:  Pre-eating: Joyous, happy / After eating: Disappointed, discouraged, regretful, giving up, resigned WANTED TO FEEL:  Happy, proud, balanced, self-control NEEDED:  Self-respect, self-acceptance, empathy, friendship, love, support, understanding, belonging  What helps in managing appetite: Appetite management means we must optimize the pleasure we get from eating.   1. Meal composition:  Obtain twice as many veg's as protein or carbohydrate foods for both lunch and dinner. 2. Mindful eating, which makes sure we are paying attention to the pleasure food provides.    - Slow down!; fork down between bites; no more eating or drinking until you have swallowed everything; no loading up the fork until you have swallowed; pause occasionally.  3. Eat at least 3 meals and 1-2 snacks per day.  Aim for no more than 5 hours between eating.  Eat breakfast within one hour of getting up.  4. Protein at each meal, while also minimizing refined carb's.    - Ask yourself the 3 Decoding Qs a few times a week, especially when you are struggling with food decisions or feelings:  1. How do I FEEL right now?  2. How do I WANT to feel?  3.  What do I truly NEED now?  - Use your lists of feelings and needs; write it down.  Email when you have completed a few of these.    - THE MAIN GOAL is to eat according to the above 1-4.   - AT NEXT APPT, WE WILL REVIEW EMOTIONAL EATING.    Resources: - Lynnell GrainBrian Wansink, PhD, [employer]   - [email or web address]:  Look for the "Have some chips, but not the whole bag" (June 2015).  Email [email or web address] to let me know what especially resonated with you about the points made.   (- Also:  Cultural norms:  For example, dessert is not a part of the first meal of the day; sodas are not a meal-time beverage; foods  served at parties do not have to be especially high in calories and surgar, etc.)

## 2014-11-08 ENCOUNTER — Encounter: Payer: Self-pay | Admitting: Family Medicine

## 2014-11-08 ENCOUNTER — Ambulatory Visit: Payer: 59 | Admitting: Family Medicine

## 2014-11-08 NOTE — Patient Instructions (Signed)
-   Three Ds of Dealing with emotional eating:  1. Delay  2. Distract  3. Distance  (And:  Determine what's going on here?  And: Decide on your plan!)  - This means practicing pressing the PAUSE button.    - EATQ by [name]:  Once you have read some, email Jeannie before next appt to let me know what concepts you especially relate to or are interested in working on.    - Red flags for making poor food decisions:    H ungry (overhungry)   A ngry   A nxious   L onely   T ired   (& Bored / Depressed)  - Remember that willpower is an Engineer, materialsexhaustible resource.    - Check out all the therapists at [facility] website. Also:   Phylliss BlakesGwen Auel,   AND  [place], [place], LPC; [phone]  [address]  [place], [place] [id number]  [email or web address]

## 2014-11-08 NOTE — Progress Notes (Signed)
Medical Nutrition Therapy:  Appt start time: 1000 end time:  1100. PCP:   Dr. Pearson GrippeJames Kim Therapist:  Jerolyn ShinJessie Lombardi (but has discontinued b/c she has moved; [facility]) Other medical team members: none  Assessment:  Primary concerns today: eating disorder.   History of eating disorder:  Jackie Davis was accompanied by her 2nd-grade daughter Jackie Davis today (schools closed).  She did not want to get weighed today, which I agreed to.  She emailed thoughtful responses to the 3 Decoding Qs she had answered several times since our last appt.  She also sent a summary of the video at the [facility] website.  Both efforts indicate Jackie Davis is starting to understand some of the drivers of her food decisions, but she remains unclear as to how to redirect her behavior.  We spent most of today's visit talking about emotional eating, and strategies for dealing with it.    24-hr recall suggests intake of ~3000 kcal:  (Up at 8 AM) B (8:30 AM)-  1 inst oatmeal (150 kcal), raspber's, 1 c flvrd cappucino (90 kcal)  290 Snk ( AM)-  --- L (1 PM)-  1 c chx stroganoff, 1 c rice, 1 c potato sticks, diet Coke  690 Snk ( PM)-  1 c flvrd cappicino (90 kcal), 8 biscuits (300 kcal)   390 D ( PM)-  2 c chx stroganoff*, 2 c rice, 2 c potato sticks, diet Coke           1380 Snk ( PM)-  1 small ice cream cone        160 Typical day? No.  Jackie Davis was "stuck at home all day" yesterday, and was aware of making comfort-food choices.   *Chx, corn, heart of palms, "milk cream," ketchup, seasoning.    Progress Towards Goal(s):  In progress.   Nutritional Diagnosis:  [place]-3.3 Overweight/obesity As related to energy imbalance.  As evidenced by BMI >29.    Intervention:  Nutrition counseling.  Handouts given during visit include:  AVS  Demonstrated degree of understanding via:  Teach Back   Monitoring/Evaluation:  Dietary intake, exercise, and body weight in 5 week(s).

## 2014-12-05 ENCOUNTER — Encounter: Payer: Self-pay | Admitting: Family Medicine

## 2014-12-05 ENCOUNTER — Ambulatory Visit (INDEPENDENT_AMBULATORY_CARE_PROVIDER_SITE_OTHER): Payer: 59 | Admitting: Family Medicine

## 2014-12-05 VITALS — Ht 68.0 in

## 2014-12-05 DIAGNOSIS — F509 Eating disorder, unspecified: Secondary | ICD-10-CM

## 2014-12-05 NOTE — Progress Notes (Signed)
Medical Nutrition Therapy:  Appt start time: 1000 end time:  1100. PCP:   Dr. Pearson GrippeJames Kim Therapist:  Jerolyn ShinJessie Lombardi (but has discontinued b/c she has moved; [facility]) Other medical team members: none  Assessment:  Primary concerns today: eating disorder (F50.9).   History of eating disorder:  Silvestre MesiCristina said her eating is not going well.  She has not sought a therapist yet, and recognizes that she has been procrastinating on this.  She did check out [name]' book EAT.Q from [employer] library, and has started reading it, but said it is frustrating that she seems to not be able to stop herself from making poor food choices.    24-hr recall:  (Up at 10 AM) B (10:30 AM)-  1 c hot choc, 1 slc cake Snk ( AM)-  --- L (1 PM)-  3/4 Stk&Shk [place] Melt sandw, diet Coke Snk (4 PM)-  2 rice cakes, crm chs, 1 egg, water D (7 PM)-  1 slc pizza, diet Coke Snk (10 PM)-  10 Thin Mints, water Typical day? No. Weekdays are "better."  Progress Towards Goal(s):  In progress.   Nutritional Diagnosis:  [place]-3.3 Overweight/obesity As related to energy imbalance.  As evidenced by BMI >29.    Intervention:  Nutrition counseling.  Handouts given during visit include:  AVS  Form on which to record info re. therapists once she calls to inquire about scheduling.   Demonstrated degree of understanding via:  Teach Back   Monitoring/Evaluation:  Dietary intake, exercise, and body weight in 4 week(s).

## 2014-12-05 NOTE — Patient Instructions (Addendum)
-   Recommendation:  Limit sweets till after lunch.  This may help you manage your appetite better through the day.   - Ask yourself the 3 Decoding Qs a few times a week, especially when you are struggling with food decisions or feelings: 1. How do I FEEL right now? 2. How do I WANT to feel? 3. What do I truly NEED now? - Use your lists of feelings and needs; write it down. Email when you have completed a few of these.   - Continue reading EATQ.  Take some notes on parts that are especially of interest to you.  Bring your notes to follow-up.   - Mindfulness helps us make better decisions AND also helps us to enjoy the moment  THOUGHTS >>>> FEELINGS >>>> BEHAVIORS  - Make an insightful decision or a reactive decision - You may want to consider [place] [facility] [employer]:  [email or web address]

## 2014-12-12 ENCOUNTER — Ambulatory Visit: Payer: 59 | Admitting: Family Medicine

## 2015-01-02 ENCOUNTER — Encounter: Payer: Self-pay | Admitting: Family Medicine

## 2015-01-02 ENCOUNTER — Ambulatory Visit (INDEPENDENT_AMBULATORY_CARE_PROVIDER_SITE_OTHER): Payer: 59 | Admitting: Family Medicine

## 2015-01-02 VITALS — Ht 68.0 in

## 2015-01-02 DIAGNOSIS — F509 Eating disorder, unspecified: Secondary | ICD-10-CM

## 2015-01-02 NOTE — Patient Instructions (Signed)
-   Challenges to changing eating behaviors include:    - Behavior chains   - Negative feelings  - Habit  - Hyper-palatable food ingredients (sugar, fat, and/or salt)  - Boredom  - Geneen Roth:  No matter what you ate in the last 24 min, 24 hours, or 24 years, the next time you feel hungry, EAT.    - Suggestion:  Make a list of activities you would like to try.  (Ask at [employer] [facility] for any cooking classes.)  - When you have made a regretted food choice (definition of regretted food choice = Excess quantity; poor nutritional quality; feeling out of control):    - Analyze why you made that choice.  (Write it down!)  - What might you do differently given the same circumstances?  - What's your plan RIGHT NOW?  - It's important to envision success.  What does this mean to you?

## 2015-01-02 NOTE — Progress Notes (Signed)
Medical Nutrition Therapy:  Appt start time: 1000 end time:  1100. PCP:   Dr. Pearson GrippeJames Davis Therapist:  Phylliss BlakesGwen Auel, LCSW [phone]) Other medical team members: none  Assessment:  Primary concerns today: eating disorder (F50.9).   No weight check today per patient's request.  Jackie Davis has been reading the Jackie Davis book, EAT.Q.  What she has learned:  Pause at least 5 min before eating/making a food choice; determine what her feelings are; distract herself if she is not actually hungry; if she decides to eat, choose a healthier option; give full attn to food when eating.    Jackie Davis has seen therapist Jackie Davis one time, and plans to continue to see her.  She still identifies boredom, loneliness, and sadness as contributors to eating behaviors.  She has started doing yoga, but finds it boring.  Not sure what exercise to get into.  She has experienced many episodes of eating with a sense of being out of control.  Jackie Davis has not written responses to the decoding Qs; finds it difficult to make herself do this.    Progress Towards Goal(s):  In progress.   Nutritional Diagnosis:  -3.3 Overweight/obesity As related to energy imbalance.  As evidenced by BMI >29.    Intervention:  Nutrition counseling.  Handouts given during visit include:  AVS  Demonstrated degree of understanding via:  Teach Back   Monitoring/Evaluation:  Dietary intake, exercise, and body weight in 4 week(s).

## 2015-01-30 ENCOUNTER — Ambulatory Visit (INDEPENDENT_AMBULATORY_CARE_PROVIDER_SITE_OTHER): Payer: 59 | Admitting: Family Medicine

## 2015-01-30 ENCOUNTER — Encounter: Payer: Self-pay | Admitting: Family Medicine

## 2015-01-30 VITALS — Ht 68.0 in

## 2015-01-30 DIAGNOSIS — R635 Abnormal weight gain: Secondary | ICD-10-CM

## 2015-01-30 NOTE — Progress Notes (Signed)
Medical Nutrition Therapy:  Appt start time: 1000 end time:  1100. PCP:   Dr. Pearson GrippeJames Kim Therapist:  Phylliss BlakesGwen Auel, LCSW [phone]) Other medical team members: none  Assessment:  Primary concerns today: eating disorder (F50.9).   Silvestre MesiCristina completed one analysis of a regretted food choice, as discussed at her last appt.  She determined that she made the choice b/c of feeling bored, upset, overwhelmed, and stressed.  She identified what she might do in the same circumstances:  Call somebody or go to the gym; Listen to relaxed music/lie down on a hammock with a book; Talk to a friend; go for a walk with the dogs; Have a glass of wine or beer; Take a bubble bath, watch a good movie.  Although she also wrote about non-food activities she could do, it was more about one major project she is working on rather than a list of separate activities.  Silvestre MesiCristina wrote that her vision of success "means feeling happy with myself, comfortable in my own skin. It means being content with my daily life and not having to worry or think too much about food. I think once I reach this point, I will automatically stop overeating and food will stop being my emotional enemy."    She said she avoided sweets for two weeks, but started eating them again after getting a bad cold.  We discussed the need to look beyond feeling bored, that identifying the reasons she felt overwhelmed would be a first step to identify what she can actually do to start feeling better - rather than turning to food.  Also discussed the need to good self-care, and the recognition that we each deserve rest and relaxation as well as fulfillment.    24-hr recall:  (Up at 9 AM) B (9 AM)-  1 c Multi-grain Cheerios, 1 c soymilk Snk ( AM)-   L (12:30 AM)-  1 c rice, 1 c paneer marsala, 1/2 c raw veg's, 1/2 c passion fruit mousse, 12 oz beer, water Snk (4 PM)-  2 fig bars D (6 PM)-  Fajita bowl: rice, beans, Malawiturkey, chs, veg's, guac, sour crm, chips, diet  Coke Snk (9 PM)-  4 small waffles, strawberry filling* Typical day? Yes.    *Identified as a regretted food choice, the start of which was at the grocery store when she bought them.    Progress Towards Goal(s):  In progress.   Nutritional Diagnosis:  [place]-3.3 Overweight/obesity As related to energy imbalance.  As evidenced by BMI >29.    Intervention:  Nutrition counseling.  Handouts given during visit include:  AVS  Demonstrated degree of understanding via:  Teach Back   Monitoring/Evaluation:  Dietary intake, exercise, and body weight in 4 week(s).

## 2015-01-30 NOTE — Patient Instructions (Addendum)
-   Again use your analysis for any regretted food choices:  - When you have made a regretted food choice (definition of regretted food choice = Excess quantity; poor nutritional quality; feeling out of control):   - Analyze why you made that choice. (Write it down!)      - Use your Feelings list when considering this.     - What might you do differently given the same circumstances?  - What's your plan RIGHT NOW? - Write down your possible activities, to include those that can be done in a few minutes to hours.  Also include things that are fun as well as tasks that just need to be done.    - Consider our discussion today about self-care.   - When you shop, make a rule for yourself:  Either NO impulse buys OR never go when hungry.   - Look into [employer] for an art project.   - Protein shakes:  Try Orgain protein powder (use 1/2 dose in milk).

## 2015-02-27 ENCOUNTER — Ambulatory Visit: Payer: 59 | Admitting: Family Medicine

## 2015-03-27 ENCOUNTER — Ambulatory Visit: Payer: 59 | Admitting: Family Medicine

## 2015-04-24 ENCOUNTER — Ambulatory Visit: Payer: 59 | Admitting: Family Medicine

## 2015-12-04 MED FILL — ESCITALOPRAM 10 MG TABLET: 10 | 30 days supply | Qty: 30 | Fill #0

## 2015-12-04 MED FILL — metroNIDAZOLE 0.75 % GEL: 0.75 | 10 days supply | Qty: 45 | Fill #1

## 2016-01-18 MED FILL — ESCITALOPRAM 10 MG TABLET: 10 | 30 days supply | Qty: 30 | Fill #1

## 2016-02-19 MED FILL — ESCITALOPRAM 10 MG TABLET: 10 | 30 days supply | Qty: 30 | Fill #2

## 2016-02-22 ENCOUNTER — Encounter: Payer: Self-pay | Admitting: Obstetrics and Gynecology

## 2016-02-22 ENCOUNTER — Ambulatory Visit (INDEPENDENT_AMBULATORY_CARE_PROVIDER_SITE_OTHER): Payer: 59 | Admitting: Obstetrics and Gynecology

## 2016-02-22 VITALS — BP 104/70 | HR 70 | Resp 18 | Ht 67.5 in | Wt 204.4 lb

## 2016-02-22 DIAGNOSIS — N9489 Other specified conditions associated with female genital organs and menstrual cycle: Secondary | ICD-10-CM

## 2016-02-22 DIAGNOSIS — Z01419 Encounter for gynecological examination (general) (routine) without abnormal findings: Secondary | ICD-10-CM

## 2016-02-22 DIAGNOSIS — Z0000 Encounter for general adult medical examination without abnormal findings: Secondary | ICD-10-CM

## 2016-02-22 DIAGNOSIS — M25559 Pain in unspecified hip: Secondary | ICD-10-CM

## 2016-02-22 DIAGNOSIS — N898 Other specified noninflammatory disorders of vagina: Secondary | ICD-10-CM

## 2016-02-22 LAB — POCT URINALYSIS DIPSTICK
BILIRUBIN UA: NEGATIVE
Blood, UA: NEGATIVE
GLUCOSE UA: NEGATIVE
KETONES UA: NEGATIVE
LEUKOCYTES UA: NEGATIVE
Nitrite, UA: NEGATIVE
Protein, UA: NEGATIVE
Urobilinogen, UA: NEGATIVE
pH, UA: 5

## 2016-02-22 MED ORDER — VALACYCLOVIR HCL 1 G PO TABS: 1000.0000 mg | ORAL_TABLET | Freq: Every day | ORAL | Status: DC

## 2016-02-22 NOTE — Progress Notes (Signed)
Patient ID: Jackie Davis, female   DOB: [birth year], 40 y.o.   MRN: [id number] 40 y.o. [id number] Married Latin female here for annual exam.    Has new dx of HSV. Uncertain of length of time having the diagnosis. Delayed to have this diagnosis because patient thought it was vaginitis. Not known if she had a confirmatory test. Valtrex 1 gm daily controls symptoms fairly well.  Still with some perianal itching and not sure what it is. Uses wipes in this area.   Hx of endometrial polyp. Had dilation and curettage in past.  Some abdominal fullness and pelvic pain prior to menses sometimes.  Wants ultrasound.   Note vaginal odor and wants evaluation for this.   Slightly elevated cholesterol.  Not taking any Rx prescription.   Daughter is 42 years old.  Husband is a Development worker, community at [employer]. Has a [employer] shop, [employer] in [place].   PCP:  Pearson Grippe, MD   Patient's last menstrual period was 02/12/2016 (exact date).     Period Cycle (Days): 30 Period Duration (Days): 3-5 Period Pattern: Regular Menstrual Flow: Heavy Menstrual Control: Tampon Menstrual Control Change Freq (Hours): every 4-6 hrs on heaviest day Dysmenorrhea: (!) Moderate Dysmenorrhea Symptoms: Cramping     Sexually active: Yes.  female  The current method of family planning is none.    Exercising: No.   Smoker:  no  Health Maintenance: Pap:  ?2016 normal per patient;unsure of HR HPV testing History of abnormal Pap:  no MMG:  Never Colonoscopy:  n/a BMD:   n/a  Result  n/a TDaP:  Unsure Gardasil:   N/A HIV:  Pregnancy. Hep C:  NA.  Screening Labs:  Hb today: PCP, Urine today: Neg   reports that she has never smoked. She does not have any smokeless tobacco history on file. She reports that she does not drink alcohol or use illicit drugs.  Past Medical History  Diagnosis Date  . ADD (attention deficit disorder)   . Positive PPD   . Anxiety   . Vitamin D deficiency   . STD (sexually  transmitted disease)     Hx HSV II  . Hypercholesterolemia     Past Surgical History  Procedure Laterality Date  . Liposuction      Current Outpatient Prescriptions  Medication Sig Dispense Refill  . ALPRAZolam (XANAX) 0.25 MG tablet Take 0.25 mg by mouth as needed for anxiety.    . cholecalciferol (VITAMIN D) 1000 UNITS tablet Take 5,000 Units by mouth daily.     Marland Kitchen escitalopram (LEXAPRO) 10 MG tablet Take 1 tablet by mouth 2 (two) times daily.  12  . Multiple Vitamin (MULTIVITAMIN) capsule Take 1 capsule by mouth daily.    . Phentermine-Topiramate 3.75-23 MG CP24 Take 3.75 mg by mouth 1 day or 1 dose.    . Vitamin D, Ergocalciferol, (DRISDOL) 50000 units CAPS capsule Take 50,000 Units by mouth every 28 (twenty-eight) days.    . valACYclovir (VALTREX) 1000 MG tablet Take 1 tablet (1,000 mg total) by mouth daily. Take one tablet twice a day for 3 days prn infection. 40 tablet 11   No current facility-administered medications for this visit.    Family History  Problem Relation Age of Onset  . Diabetes Father   . Heart disease Father   . Healthy Mother   . Hyperlipidemia Mother   . Healthy Other     Sibling  . Hypertension Other   . Cancer Maternal Uncle 50  Dec Glyoblastoma age 40  . Hyperlipidemia Maternal Grandmother     ROS:  Pertinent items are noted in HPI.  Otherwise, a comprehensive ROS was negative.  Exam:   BP 104/70 mmHg  Pulse 70  Resp 18  Ht 5' 7.5" (1.715 m)  Wt 204 lb 6.4 oz (92.715 kg)  BMI 31.52 kg/m2  LMP 02/12/2016 (Exact Date)    General appearance: alert, cooperative and appears stated age Head: Normocephalic, without obvious abnormality, atraumatic Neck: no adenopathy, supple, symmetrical, trachea midline and thyroid normal to inspection and palpation Lungs: clear to auscultation bilaterally Breasts: normal appearance, no masses or tenderness, Inspection negative, No nipple retraction or dimpling, No nipple discharge or bleeding, No axillary or  supraclavicular adenopathy Heart: regular rate and rhythm Abdomen: incisions:  No.    , soft, non-tender; no masses, no organomegaly Extremities: extremities normal, atraumatic, no cyanosis or edema Skin: Skin color, texture, turgor normal. No rashes or lesions Lymph nodes: Cervical, supraclavicular, and axillary nodes normal. No abnormal inguinal nodes palpated Neurologic: Grossly normal  Pelvic: External genitalia:  no lesions              Urethra:  normal appearing urethra with no masses, tenderness or lesions              Bartholins and Skenes: normal                 Vagina: normal appearing vagina with normal color and discharge, no lesions              Cervix: no lesions              Pap taken: No. Bimanual Exam:  Uterus:  normal size, contour, position, consistency, mobility, non-tender              Adnexa: normal adnexa and no mass, fullness, tenderness              Rectal exam: Yes.  .  Confirms.              Anus:  normal sphincter tone, no lesions  Chaperone was present for exam.  Assessment:   Well woman visit with normal exam. Hx HSV.  May not have been formally diagnosed. Pelvic pain.  Vaginal odor.   Plan: Yearly mammogram recommended after age 40.  [facility]  Recommended self breast exam.  Pap and HR HPV as above. Recommendations for Calcium, Vitamin D, regular exercise program including cardiovascular and weight bearing exercise. Labs performed.  Yes.  .   See orders.  Affirm.  Prescription medication(s) given.  Yes.  .  See orders.  Valtrex 1000 mg daily. Increase to valtrex 100 mg po bid for an outbreak.  PNV. Return for pelvic ultrasound. Follow up annually and prn.       After visit summary provided.

## 2016-02-22 NOTE — Patient Instructions (Signed)
Health Maintenance, Female Adopting a healthy lifestyle and getting preventive care can go a long way to promote health and wellness. Talk with your health care provider about what schedule of regular examinations is right for you. This is a good chance for you to check in with your provider about disease prevention and staying healthy. In between checkups, there are plenty of things you can do on your own. Experts have done a lot of research about which lifestyle changes and preventive measures are most likely to keep you healthy. Ask your health care provider for more information. WEIGHT AND DIET  Eat a healthy diet  Be sure to include plenty of vegetables, fruits, low-fat dairy products, and lean protein.  Do not eat a lot of foods high in solid fats, added sugars, or salt.  Get regular exercise. This is one of the most important things you can do for your health.  Most adults should exercise for at least 150 minutes each week. The exercise should increase your heart rate and make you sweat (moderate-intensity exercise).  Most adults should also do strengthening exercises at least twice a week. This is in addition to the moderate-intensity exercise.  Maintain a healthy weight  Body mass index (BMI) is a measurement that can be used to identify possible weight problems. It estimates body fat based on height and weight. Your health care provider can help determine your BMI and help you achieve or maintain a healthy weight.  For females 20 years of age and older:   A BMI below 18.5 is considered underweight.  A BMI of 18.5 to 24.9 is normal.  A BMI of 25 to 29.9 is considered overweight.  A BMI of 30 and above is considered obese.  Watch levels of cholesterol and blood lipids  You should start having your blood tested for lipids and cholesterol at 40 years of age, then have this test every 5 years.  You may need to have your cholesterol levels checked more often if:  Your lipid  or cholesterol levels are high.  You are older than 40 years of age.  You are at high risk for heart disease.  CANCER SCREENING   Lung Cancer  Lung cancer screening is recommended for adults 55-80 years old who are at high risk for lung cancer because of a history of smoking.  A yearly low-dose CT scan of the lungs is recommended for people who:  Currently smoke.  Have quit within the past 15 years.  Have at least a 30-pack-year history of smoking. A pack year is smoking an average of one pack of cigarettes a day for 1 year.  Yearly screening should continue until it has been 15 years since you quit.  Yearly screening should stop if you develop a health problem that would prevent you from having lung cancer treatment.  Breast Cancer  Practice breast self-awareness. This means understanding how your breasts normally appear and feel.  It also means doing regular breast self-exams. Let your health care provider know about any changes, no matter how small.  If you are in your 20s or 30s, you should have a clinical breast exam (CBE) by a health care provider every 1-3 years as part of a regular health exam.  If you are 40 or older, have a CBE every year. Also consider having a breast X-ray (mammogram) every year.  If you have a family history of breast cancer, talk to your health care provider about genetic screening.  If you   are at high risk for breast cancer, talk to your health care provider about having an MRI and a mammogram every year.  Breast cancer gene (BRCA) assessment is recommended for women who have family members with BRCA-related cancers. BRCA-related cancers include:  Breast.  Ovarian.  Tubal.  Peritoneal cancers.  Results of the assessment will determine the need for genetic counseling and BRCA1 and BRCA2 testing. Cervical Cancer Your health care provider may recommend that you be screened regularly for cancer of the pelvic organs (ovaries, uterus, and  vagina). This screening involves a pelvic examination, including checking for microscopic changes to the surface of your cervix (Pap test). You may be encouraged to have this screening done every 3 years, beginning at age 21.  For women ages 30-65, health care providers may recommend pelvic exams and Pap testing every 3 years, or they may recommend the Pap and pelvic exam, combined with testing for human papilloma virus (HPV), every 5 years. Some types of HPV increase your risk of cervical cancer. Testing for HPV may also be done on women of any age with unclear Pap test results.  Other health care providers may not recommend any screening for nonpregnant women who are considered low risk for pelvic cancer and who do not have symptoms. Ask your health care provider if a screening pelvic exam is right for you.  If you have had past treatment for cervical cancer or a condition that could lead to cancer, you need Pap tests and screening for cancer for at least 20 years after your treatment. If Pap tests have been discontinued, your risk factors (such as having a new sexual partner) need to be reassessed to determine if screening should resume. Some women have medical problems that increase the chance of getting cervical cancer. In these cases, your health care provider may recommend more frequent screening and Pap tests. Colorectal Cancer  This type of cancer can be detected and often prevented.  Routine colorectal cancer screening usually begins at 40 years of age and continues through 40 years of age.  Your health care provider may recommend screening at an earlier age if you have risk factors for colon cancer.  Your health care provider may also recommend using home test kits to check for hidden blood in the stool.  A small camera at the end of a tube can be used to examine your colon directly (sigmoidoscopy or colonoscopy). This is done to check for the earliest forms of colorectal  cancer.  Routine screening usually begins at age 50.  Direct examination of the colon should be repeated every 5-10 years through 40 years of age. However, you may need to be screened more often if early forms of precancerous polyps or small growths are found. Skin Cancer  Check your skin from head to toe regularly.  Tell your health care provider about any new moles or changes in moles, especially if there is a change in a mole's shape or color.  Also tell your health care provider if you have a mole that is larger than the size of a pencil eraser.  Always use sunscreen. Apply sunscreen liberally and repeatedly throughout the day.  Protect yourself by wearing long sleeves, pants, a wide-brimmed hat, and sunglasses whenever you are outside. HEART DISEASE, DIABETES, AND HIGH BLOOD PRESSURE   High blood pressure causes heart disease and increases the risk of stroke. High blood pressure is more likely to develop in:  People who have blood pressure in the high end   of the normal range (130-139/85-89 mm Hg).  People who are overweight or obese.  People who are African American.  If you are 38-23 years of age, have your blood pressure checked every 3-5 years. If you are 61 years of age or older, have your blood pressure checked every year. You should have your blood pressure measured twice--once when you are at a hospital or clinic, and once when you are not at a hospital or clinic. Record the average of the two measurements. To check your blood pressure when you are not at a hospital or clinic, you can use:  An automated blood pressure machine at a pharmacy.  A home blood pressure monitor.  If you are between 45 years and 39 years old, ask your health care provider if you should take aspirin to prevent strokes.  Have regular diabetes screenings. This involves taking a blood sample to check your fasting blood sugar level.  If you are at a normal weight and have a low risk for diabetes,  have this test once every three years after 40 years of age.  If you are overweight and have a high risk for diabetes, consider being tested at a younger age or more often. PREVENTING INFECTION  Hepatitis B  If you have a higher risk for hepatitis B, you should be screened for this virus. You are considered at high risk for hepatitis B if:  You were born in a country where hepatitis B is common. Ask your health care provider which countries are considered high risk.  Your parents were born in a high-risk country, and you have not been immunized against hepatitis B (hepatitis B vaccine).  You have HIV or AIDS.  You use needles to inject street drugs.  You live with someone who has hepatitis B.  You have had sex with someone who has hepatitis B.  You get hemodialysis treatment.  You take certain medicines for conditions, including cancer, organ transplantation, and autoimmune conditions. Hepatitis C  Blood testing is recommended for:  Everyone born from [birth year] through 1965.  Anyone with known risk factors for hepatitis C. Sexually transmitted infections (STIs)  You should be screened for sexually transmitted infections (STIs) including gonorrhea and chlamydia if:  You are sexually active and are younger than 40 years of age.  You are older than 40 years of age and your health care provider tells you that you are at risk for this type of infection.  Your sexual activity has changed since you were last screened and you are at an increased risk for chlamydia or gonorrhea. Ask your health care provider if you are at risk.  If you do not have HIV, but are at risk, it may be recommended that you take a prescription medicine daily to prevent HIV infection. This is called pre-exposure prophylaxis (PrEP). You are considered at risk if:  You are sexually active and do not regularly use condoms or know the HIV status of your partner(s).  You take drugs by injection.  You are sexually  active with a partner who has HIV. Talk with your health care provider about whether you are at high risk of being infected with HIV. If you choose to begin PrEP, you should first be tested for HIV. You should then be tested every 3 months for as long as you are taking PrEP.  PREGNANCY   If you are premenopausal and you may become pregnant, ask your health care provider about preconception counseling.  If you may  become pregnant, take 400 to 800 micrograms (mcg) of folic acid every day.  If you want to prevent pregnancy, talk to your health care provider about birth control (contraception). OSTEOPOROSIS AND MENOPAUSE   Osteoporosis is a disease in which the bones lose minerals and strength with aging. This can result in serious bone fractures. Your risk for osteoporosis can be identified using a bone density scan.  If you are 61 years of age or older, or if you are at risk for osteoporosis and fractures, ask your health care provider if you should be screened.  Ask your health care provider whether you should take a calcium or vitamin D supplement to lower your risk for osteoporosis.  Menopause may have certain physical symptoms and risks.  Hormone replacement therapy may reduce some of these symptoms and risks. Talk to your health care provider about whether hormone replacement therapy is right for you.  HOME CARE INSTRUCTIONS   Schedule regular health, dental, and eye exams.  Stay current with your immunizations.   Do not use any tobacco products including cigarettes, chewing tobacco, or electronic cigarettes.  If you are pregnant, do not drink alcohol.  If you are breastfeeding, limit how much and how often you drink alcohol.  Limit alcohol intake to no more than 1 drink per day for nonpregnant women. One drink equals 12 ounces of beer, 5 ounces of wine, or 1 ounces of hard liquor.  Do not use street drugs.  Do not share needles.  Ask your health care provider for help if  you need support or information about quitting drugs.  Tell your health care provider if you often feel depressed.  Tell your health care provider if you have ever been abused or do not feel safe at home.   This information is not intended to replace advice given to you by your health care provider. Make sure you discuss any questions you have with your health care provider.   Document Released: 04/15/2011 Document Revised: 10/21/2014 Document Reviewed: 09/01/2013 Elsevier Interactive Patient Education [employer].

## 2016-02-23 ENCOUNTER — Telehealth: Payer: Self-pay

## 2016-02-23 ENCOUNTER — Telehealth: Payer: Self-pay | Admitting: Obstetrics and Gynecology

## 2016-02-23 LAB — WET PREP BY MOLECULAR PROBE
Candida species: POSITIVE — AB
GARDNERELLA VAGINALIS: POSITIVE — AB
TRICHOMONAS VAG: NEGATIVE

## 2016-02-23 MED ORDER — FLUCONAZOLE 150 MG PO TABS: 150.0000 mg | ORAL_TABLET | Freq: Once | ORAL | Status: DC

## 2016-02-23 MED ORDER — METRONIDAZOLE 500 MG PO TABS: 500.0000 mg | ORAL_TABLET | Freq: Two times a day (BID) | ORAL | Status: DC

## 2016-02-23 NOTE — Telephone Encounter (Signed)
Spoke with patient. Advised of message and results as seen below from Dr.Silva. She is agreeable and verbalizes understanding. Rx for Flagyl 500 mg po bid x 7 days #14 0RF and Dilfucan 150 mg po x 1 repeat in 72 hours if needed #2 0RF sent to pharmacy on file. ETOH precautions given.She is agreeable.  Routing to provider for final review. Patient agreeable to disposition. Will close encounter.

## 2016-02-23 NOTE — Telephone Encounter (Signed)
Called patient to review benefits for a recommended procedure. Left Voicemail requesting a call back. °

## 2016-02-23 NOTE — Telephone Encounter (Signed)
-----   Message from Patton SallesBrook E Amundson C Silva, MD sent at 02/23/2016 12:50 PM EDT ----- Please let patient know about her Affirm testing showing both bacterial vaginosis and candida.  I am recommending Flagyl 500 mg po bid for 7 days and Diflucan 150 mg po x 1 with repeat in 72 hours if needed.  Please sent to pharmacy of choice.  Give EOTH precautions.  The Flagyl should resolve the vaginal odor she was experiencing.   Cc- Claudette LawsAmanda Dixon

## 2016-02-26 NOTE — Telephone Encounter (Signed)
Called patient to review benefits for a recommended procedure. Left Voicemail requesting a call back. °

## 2016-02-27 NOTE — Telephone Encounter (Signed)
Ok to remove the order for pelvic ultrasound and close the referral.

## 2016-02-27 NOTE — Telephone Encounter (Signed)
Spoke with patient regarding recommended ultrasound. Patient states, "she is no longer in pain and feels there is no need for this".  Routing to Dr Edward JollySilva for review

## 2016-03-25 MED FILL — ESCITALOPRAM 10 MG TABLET: 10 | 30 days supply | Qty: 30 | Fill #3

## 2016-04-02 ENCOUNTER — Telehealth: Payer: Self-pay | Admitting: Obstetrics and Gynecology

## 2016-04-02 NOTE — Telephone Encounter (Signed)
Spoke with patient. Patient states that she would like to discuss having and IUD placed with Dr.Silva. Appointment scheduled for tomorrow 04/03/2016 at 9:30am. She is agreeable to date and time. Reports she has unprotected intercourse and took a Plan B this morning. Reports she took this within 72 hours of unprotected intercourse. Asking if she needs a prescription to take another form of Plan B. Advised since she has already taken Plan B within 72 hours of unprotected intercourse she does not need to do anything further at this time. Encouraged to use BUM with intercourse. She is agreeable.  Routing to provider for final review. Patient agreeable to disposition. Will close encounter.

## 2016-04-02 NOTE — Telephone Encounter (Signed)
Patient wants to discuss getting an IUD. °

## 2016-04-03 ENCOUNTER — Ambulatory Visit (INDEPENDENT_AMBULATORY_CARE_PROVIDER_SITE_OTHER): Payer: 59 | Admitting: Obstetrics and Gynecology

## 2016-04-03 ENCOUNTER — Encounter: Payer: Self-pay | Admitting: Obstetrics and Gynecology

## 2016-04-03 ENCOUNTER — Telehealth: Payer: Self-pay | Admitting: Obstetrics and Gynecology

## 2016-04-03 VITALS — BP 110/78 | HR 70 | Ht 67.5 in | Wt 197.8 lb

## 2016-04-03 DIAGNOSIS — Z3009 Encounter for other general counseling and advice on contraception: Secondary | ICD-10-CM | POA: Diagnosis not present

## 2016-04-03 DIAGNOSIS — Z309 Encounter for contraceptive management, unspecified: Secondary | ICD-10-CM

## 2016-04-03 DIAGNOSIS — Z308 Encounter for other contraceptive management: Secondary | ICD-10-CM

## 2016-04-03 DIAGNOSIS — Z113 Encounter for screening for infections with a predominantly sexual mode of transmission: Secondary | ICD-10-CM | POA: Diagnosis not present

## 2016-04-03 MED ORDER — DIAPHRAGM WIDE SEAL 70 MM VA DPRH: 1.0000 [IU] | VAGINAL_INSERT | VAGINAL | Status: DC | PRN

## 2016-04-03 NOTE — Telephone Encounter (Signed)
Patient is asking to talk with Dr.Silva's nurse. No details given. Patient was seen today.

## 2016-04-03 NOTE — Progress Notes (Signed)
Patient ID: Jackie Davis, female   DOB: 1976/06/11, 40 y.o.   MRN: [id number] GYNECOLOGY  VISIT   HPI: 40 y.o.   Married Latin female   G1P1001 with Patient's last menstrual period was 03/15/2016 (exact date).   here for consultation regarding IUD insertion    Took Plan B yesterday for emergency contraception.  Unprotected intercourse was yesterday and day before yesterday.   Has a new partner. Has need for immediate contraception.  Menses are regular.  Currently has pad change 3 times a day, and has cramping.   Hx PCOS.  Irregular menses as a teenager.   GYNECOLOGIC HISTORY: Patient's last menstrual period was 03/15/2016 (exact date). Contraception:  none Menopausal hormone therapy:  n/a Last mammogram:  NEVER Last pap smear:   2016 normal per patient;unsure of HPV testing.        OB History    Gravida Para Term Preterm AB TAB SAB Ectopic Multiple Living   Patient Active Problem List   Diagnosis Date Noted  . PCOS (polycystic ovarian syndrome) 06/23/2014  . Right knee pain 02/10/2014  . Weight gain 11/29/2013  . POSITIVE PPD 12/01/2009  . CRUSHING INJURY, FINGER 12/01/2009    Past Medical History  Diagnosis Date  . ADD (attention deficit disorder)   . Positive PPD   . Anxiety   . Vitamin D deficiency   . STD (sexually transmitted disease)     Hx HSV II  . Hypercholesterolemia     Past Surgical History  Procedure Laterality Date  . Liposuction    . Labial reduction      vulvar surgery    Current Outpatient Prescriptions  Medication Sig Dispense Refill  . ALPRAZolam (XANAX) 0.25 MG tablet Take 0.25 mg by mouth as needed for anxiety.    . Cholecalciferol (REPLESTA NX PO) Take 1 tablet by mouth every 30 (thirty) days.    Marland Kitchen escitalopram (LEXAPRO) 10 MG tablet Take 1 tablet by mouth 2 (two) times daily.  12  . Phentermine-Topiramate 3.75-23 MG CP24 Take 3.75 mg by mouth 1 day or 1 dose.    . valACYclovir (VALTREX) 1000 MG  tablet Take 1 tablet (1,000 mg total) by mouth daily. Take one tablet twice a day for 3 days prn infection. 40 tablet 11   No current facility-administered medications for this visit.     ALLERGIES: Review of patient's allergies indicates no known allergies.  Family History  Problem Relation Age of Onset  . Diabetes Father   . Heart disease Father   . Healthy Mother   . Hyperlipidemia Mother   . Healthy Other     Sibling  . Hypertension Other   . Cancer Maternal Uncle 38    Dec Glyoblastoma age 59  . Hyperlipidemia Maternal Grandmother     Social History   Social History  . Marital Status: Married    Spouse Name: N/A  . Number of Children: 1  . Years of Education: N/A   Occupational History  . masters education    Social History Main Topics  . Smoking status: Never Smoker   . Smokeless tobacco: Not on file  . Alcohol Use: No  . Drug Use: No  . Sexual Activity:    Partners: Male    Birth Control/ Protection: None   Other Topics Concern  . Not on file   Social History Narrative   Spouse of  Dr. Marchelle Gearingamaswamy    ROS:  Pertinent items are noted in HPI.  PHYSICAL EXAMINATION:    BP 110/78 mmHg  Pulse 70  Ht 5' 7.5" (1.715 m)  Wt 197 lb 12.8 oz (89.721 kg)  BMI 30.50 kg/m2  LMP 03/15/2016 (Exact Date)    General appearance: alert, cooperative and appears stated age   Pelvic: External genitalia:  no lesions              Urethra:  normal appearing urethra with no masses, tenderness or lesions              Bartholins and Skenes: normal                 Vagina: normal appearing vagina with normal color and discharge, no lesions              Cervix: no lesions            Bimanual Exam:  Uterus:  normal size, contour, position, consistency, mobility, non-tender              Adnexa: no mass, fullness, tenderness       Diaphragm fitting.  #70 fitted - patient able to place and remove.           Chaperone was present for exam.  ASSESSMENT  Need for  contraception. Status post Plan B yesterday. Diaphragm fitting.  PLAN  Discussion of barrier methods of contraception - female and female condoms and diaphragm.  All to be used with spermicide. Rx for diaphragm #70 wide seal to pharmacy.  Patient instructed in use. Discussion of IUDs - ParaGard and [place]Mirena.  Discussed risks and benefits of each.  Discussed ParaGard as emergency contraception and patient decides against this as it will not give her long term benefit for controlling menses. I discussed the importance of condoms for infection prevention with an IUD. Will precert Mirena IUD and place with menses. Return for STD testing tomorrow.  Lab currently closed.   An After Visit Summary was printed and given to the patient.  _25_____ minutes face to face time of which over 50% was spent in counseling.

## 2016-04-03 NOTE — Telephone Encounter (Signed)
Return call to patient. States she has been unable to find a pharmacy that is able to order a diaphragm for her. Advised that this item can be ordered by our office since it is difficult to find at local pharmacy.  Patient declines to order due to length of time it will take for order to arrive.  Patient declines need to check on other options. States she discussed with Dr Edward JollySilva earlier today. Denies further questions and again declines for me to order diaphragm.  Routing to provider for final review. Patient agreeable to disposition. Will close encounter.

## 2016-04-04 ENCOUNTER — Other Ambulatory Visit: Payer: 59

## 2016-04-09 ENCOUNTER — Telehealth: Payer: Self-pay | Admitting: Obstetrics and Gynecology

## 2016-04-09 NOTE — Telephone Encounter (Signed)
Called patient to review benefits for a recommended procedure. Left Voicemail requesting a call back. °

## 2016-04-17 NOTE — Telephone Encounter (Signed)
Patient called stating ready to schedule iud insertion. Patient verifies that she started cycle on 04/16/16 and is requesting mirena iud insertion with Dr Edward JollySilva. Patient agreeable to insurance benefit and ready to schedule. Next available 04/19/16 at 10am with Dr Edward JollySilva. Patient agreeable. Patient scheduled and agreeable to arrival date/time. Patient requested any extra instructions/medications: advised to hydrate well, eat a meal and ability to take up to 800mg  of ibuprofen if normal tolerable medication for her.  Routing to triage for review.

## 2016-04-17 NOTE — Telephone Encounter (Signed)
OK as scheduled.  Will do UPT and STD testing the day of the insertion of the IUD.  Ok to close encounter.  Cc- Claudette LawsAmanda Dixon

## 2016-04-17 NOTE — Telephone Encounter (Signed)
Patient is G1P1.  LMP 03/15/16 with regular cycles.  Took Plan B on 04/02/16.  Dr. Edward JollySilva, okay as scheduled?

## 2016-04-17 NOTE — Telephone Encounter (Signed)
Noted message from Dr. Silva.  Will close encounter.   

## 2016-04-19 ENCOUNTER — Encounter: Payer: Self-pay | Admitting: Obstetrics and Gynecology

## 2016-04-19 ENCOUNTER — Ambulatory Visit (INDEPENDENT_AMBULATORY_CARE_PROVIDER_SITE_OTHER): Payer: 59 | Admitting: Obstetrics and Gynecology

## 2016-04-19 DIAGNOSIS — Z3009 Encounter for other general counseling and advice on contraception: Secondary | ICD-10-CM | POA: Diagnosis not present

## 2016-04-19 DIAGNOSIS — Z113 Encounter for screening for infections with a predominantly sexual mode of transmission: Secondary | ICD-10-CM

## 2016-04-19 DIAGNOSIS — Z30430 Encounter for insertion of intrauterine contraceptive device: Secondary | ICD-10-CM | POA: Diagnosis not present

## 2016-04-19 LAB — POCT URINE PREGNANCY: Preg Test, Ur: NEGATIVE

## 2016-04-19 MED ORDER — VALACYCLOVIR HCL 1 G PO TABS: 1000.0000 mg | ORAL_TABLET | Freq: Every day | ORAL | Status: DC

## 2016-04-19 NOTE — Progress Notes (Signed)
GYNECOLOGY  VISIT   HPI: 40 y.o.   Married  Hispanic  female   G1P1001 with Patient's last menstrual period was 04/15/2016.   here for Mirena IUD insertion.   Hx PCOS.   Also doing STD testing today.   Wants refill of Valtrex.   UPT - negative.    GYNECOLOGIC HISTORY: Patient's last menstrual period was 04/15/2016. Contraception:  condoms Menopausal hormone therapy:  never Last mammogram:  never Last pap smear:   Pt states last 2016, pt states normal         OB History    Gravida Para Term Preterm AB TAB SAB Ectopic Multiple Living   1 1 1       1          Patient Active Problem List   Diagnosis Date Noted  . PCOS (polycystic ovarian syndrome) 06/23/2014  . Right knee pain 02/10/2014  . Weight gain 11/29/2013  . POSITIVE PPD 12/01/2009  . CRUSHING INJURY, FINGER 12/01/2009    Past Medical History  Diagnosis Date  . ADD (attention deficit disorder)   . Positive PPD   . Anxiety   . Vitamin D deficiency   . STD (sexually transmitted disease)     Hx HSV II  . Hypercholesterolemia     Past Surgical History  Procedure Laterality Date  . Liposuction    . Labial reduction      vulvar surgery    Current Outpatient Prescriptions  Medication Sig Dispense Refill  . ALPRAZolam (XANAX) 0.25 MG tablet Take 0.25 mg by mouth as needed for anxiety.    . Cholecalciferol (REPLESTA NX PO) Take 1 tablet by mouth every 30 (thirty) days.    . Diaphragm Wide Seal 70 MM DPRH Place 1 Units vaginally as needed (Place before intercourse and leave in for 6 hours after intercourse.). 1 each 0  . escitalopram (LEXAPRO) 10 MG tablet Take 1 tablet by mouth 2 (two) times daily.  12  . Phentermine-Topiramate 3.75-23 MG CP24 Take 3.75 mg by mouth 1 day or 1 dose.    . valACYclovir (VALTREX) 1000 MG tablet Take 1 tablet (1,000 mg total) by mouth daily. Take one tablet twice a day for 3 days prn infection. 40 tablet 11   No current facility-administered medications for this visit.      ALLERGIES: Review of patient's allergies indicates no known allergies.  Family History  Problem Relation Age of Onset  . Diabetes Father   . Heart disease Father   . Healthy Mother   . Hyperlipidemia Mother   . Healthy Other     Sibling  . Hypertension Other   . Cancer Maternal Uncle [id number]    Dec Glyoblastoma age 40  . Hyperlipidemia Maternal Grandmother     Social History   Social History  . Marital Status: Married    Spouse Name: N/A  . Number of Children: 1  . Years of Education: N/A   Occupational History  . masters education    Social History Main Topics  . Smoking status: Never Smoker   . Smokeless tobacco: Not on file  . Alcohol Use: No  . Drug Use: No  . Sexual Activity:    Partners: Male    Birth Control/ Protection: None   Other Topics Concern  . Not on file   Social History Narrative   Spouse of Dr. Marchelle Gearingamaswamy    ROS:  Pertinent items are noted in HPI.  PHYSICAL EXAMINATION:    BP 112/82 mmHg  Pulse  52  Resp 14  Ht 5\' 9"  (1.753 m)  Wt 198 lb 9.6 oz (90.084 kg)  BMI 29.31 kg/m2  LMP 04/15/2016    General appearance: alert, cooperative and appears stated age   Pelvic: External genitalia:  no lesions              Urethra:  normal appearing urethra with no masses, tenderness or lesions              Bartholins and Skenes: normal                 Vagina: normal appearing vagina with normal color and discharge, no lesions              Cervix: no lesions and menstrual flow noted.            Bimanual Exam:  Uterus:  normal size, contour, position, consistency, mobility, non-tender              Adnexa: normal adnexa and no mass, fullness, tenderness          Mirena IUD insertion.  Lot number [id number], exp 01/20 Consent for procedure. Speculum placed in vagina.  Sterile prep with Hibiclens.  Tenaculum to anterior cervical lip. Paracervical block with 10 cc 1% lidocaine. Lot [id number], exp 12/12/16 Uterus sounded to 8 cm. Mirena IUD placed without  difficulty.  Strings trimmed and shown to patient.  Bimanual exam repeated.  No change. Minimal EBL.  No complications. IUD card and pamphlet to patient.  Chaperone was present for exam.  ASSESSMENT  Mirena IUD insertion.  STD testing.  Hx HSV.  PLAN  Mirena instructions and precautions given. STD testing today.  Discussed the importance of STD prevention with IUD in place.  Valtrex Rx refill for one year.  Follow up in 5 weeks and prn.   An After Visit Summary was printed and given to the patient.

## 2016-04-19 NOTE — Patient Instructions (Signed)

## 2016-04-20 LAB — HEPATITIS C ANTIBODY: HCV Ab: NEGATIVE

## 2016-04-20 LAB — STD PANEL
HIV: NONREACTIVE
Hepatitis B Surface Ag: NEGATIVE

## 2016-04-22 LAB — GC/CHLAMYDIA PROBE AMP
CT PROBE, AMP APTIMA: NOT DETECTED
GC PROBE AMP APTIMA: NOT DETECTED

## 2016-05-07 DIAGNOSIS — F419 Anxiety disorder, unspecified: Secondary | ICD-10-CM | POA: Diagnosis not present

## 2016-05-29 ENCOUNTER — Encounter: Payer: Self-pay | Admitting: Obstetrics and Gynecology

## 2016-05-29 ENCOUNTER — Ambulatory Visit (INDEPENDENT_AMBULATORY_CARE_PROVIDER_SITE_OTHER): Payer: 59 | Admitting: Obstetrics and Gynecology

## 2016-05-29 VITALS — BP 110/62 | HR 68 | Resp 16 | Ht 69.0 in | Wt 200.2 lb

## 2016-05-29 DIAGNOSIS — Z30431 Encounter for routine checking of intrauterine contraceptive device: Secondary | ICD-10-CM | POA: Diagnosis not present

## 2016-05-29 NOTE — Progress Notes (Addendum)
GYNECOLOGY  VISIT   HPI: 40 y.o.   Married  Hispanic  female   G1P1001 with Patient's last menstrual period was 04/17/2016.   here for   IUD recheck. Daughter, Coralee Northina, is with patient today.   Had Mirena IUD insertion on 04/19/16.  Had strong cramping last week.  Had light bleeding.   No pain during intercourse.   Due for mammogram.  GYNECOLOGIC HISTORY: Patient's last menstrual period was 04/17/2016. Contraception:  IUD Menopausal hormone therapy:  none Last mammogram:  n/a Last pap smear:   2016 patient states normal        OB History    Gravida Para Term Preterm AB Living   1 1 1     1    SAB TAB Ectopic Multiple Live Births           1         Patient Active Problem List   Diagnosis Date Noted  . PCOS (polycystic ovarian syndrome) 06/23/2014  . Right knee pain 02/10/2014  . Weight gain 11/29/2013  . POSITIVE PPD 12/01/2009  . CRUSHING INJURY, FINGER 12/01/2009    Past Medical History:  Diagnosis Date  . ADD (attention deficit disorder)   . Anxiety   . Hypercholesterolemia   . Positive PPD   . STD (sexually transmitted disease)    Hx HSV II  . Vitamin D deficiency     Past Surgical History:  Procedure Laterality Date  . labial reduction     vulvar surgery  . LIPOSUCTION      Current Outpatient Prescriptions  Medication Sig Dispense Refill  . ALPRAZolam (XANAX) 0.25 MG tablet Take 0.25 mg by mouth as needed for anxiety.    . Cholecalciferol (REPLESTA NX PO) Take 1 tablet by mouth every 30 (thirty) days.    Marland Kitchen. escitalopram (LEXAPRO) 10 MG tablet Take 1 tablet by mouth 2 (two) times daily.  12  . Phentermine-Topiramate 3.75-23 MG CP24 Take 3.75 mg by mouth 1 day or 1 dose.    . valACYclovir (VALTREX) 1000 MG tablet Take 1 tablet (1,000 mg total) by mouth daily. Take one tablet twice a day for 3 days prn infection. 40 tablet 11   No current facility-administered medications for this visit.      ALLERGIES: Review of patient's allergies indicates no known  allergies.  Family History  Problem Relation Age of Onset  . Diabetes Father   . Heart disease Father   . Healthy Mother   . Hyperlipidemia Mother   . Healthy Other     Sibling  . Hypertension Other   . Cancer Maternal Uncle 2850    Dec Glyoblastoma age 40  . Hyperlipidemia Maternal Grandmother     Social History   Social History  . Marital status: Married    Spouse name: N/A  . Number of children: 1  . Years of education: N/A   Occupational History  . masters education    Social History Main Topics  . Smoking status: Never Smoker  . Smokeless tobacco: Not on file  . Alcohol use No  . Drug use: No  . Sexual activity: Yes    Partners: Male    Birth control/ protection: None   Other Topics Concern  . Not on file   Social History Narrative   Spouse of Dr. Marchelle Gearingamaswamy    ROS:  Pertinent items are noted in HPI.  PHYSICAL EXAMINATION:    BP 110/62 (BP Location: Right Arm, Patient Position: Sitting, Cuff Size: Normal)  Pulse 68   Resp 16   Ht 5\' 9"  (1.753 m)   Wt 200 lb 3.2 oz (90.8 kg)   LMP 04/17/2016   BMI 29.56 kg/m     General appearance: alert, cooperative and appears stated age    Pelvic: External genitalia:  no lesions              Urethra:  normal appearing urethra with no masses, tenderness or lesions              Bartholins and Skenes: normal                 Vagina: normal appearing vagina with normal color and discharge, no lesions              Cervix: no lesions.  IUD strings trimmed to 1.5 cm length.                Bimanual Exam:  Uterus:  normal size, contour, position, consistency, mobility, non-tender              Adnexa: no mass, fullness, tenderness   Chaperone was present for exam.  ASSESSMENT  Mirena IUD patient.  Doing well.  PLAN  Reassurance that IUD is working for contraception and that her light menstruation is a known effect of the Mirena.  Follow up for annual exams and prn. Gave information for [facility].   An After Visit Summary was printed and given to the patient.  ___15___ minutes face to face time of which over 50% was spent in counseling.

## 2016-06-26 MED FILL — ESCITALOPRAM 10 MG TABLET: 10 | 30 days supply | Qty: 30 | Fill #0

## 2016-07-04 ENCOUNTER — Other Ambulatory Visit: Payer: Self-pay | Admitting: Obstetrics and Gynecology

## 2016-07-04 ENCOUNTER — Telehealth: Payer: Self-pay | Admitting: Obstetrics and Gynecology

## 2016-07-04 MED FILL — valACYclovir HCL 1 GM TABS: 1 | 40 days supply | Qty: 40 | Fill #0

## 2016-07-04 NOTE — Telephone Encounter (Signed)
Spoke with patient about refills. Dr. Edward JollySilva sent a refill on 04/19/16 #40 11R to pharmacy. Advised patient about this. She recently moved and could not find her bottle. She was not aware there were refills available. Patient stated she will call pharmacy to request refill. -sco

## 2016-07-04 NOTE — Telephone Encounter (Signed)
Tried calling patient back. Could not leave message as her voicemail is full. -sco

## 2016-07-04 NOTE — Telephone Encounter (Signed)
Patient called and requested refills on Valtrex be sent to her local pharmacy on file.

## 2016-07-04 NOTE — Telephone Encounter (Signed)
This patient was speaking with you about her prescription earlier today. This patient is asking to talk with you again.

## 2016-07-05 NOTE — Telephone Encounter (Signed)
Spoke with patient about her Valacyclovir refills. Called pharmacy and they do have prescription with refills and will fill it for her. Advised patient she can pick it up later today. -sco

## 2016-07-17 DIAGNOSIS — F419 Anxiety disorder, unspecified: Secondary | ICD-10-CM | POA: Diagnosis not present

## 2016-07-17 DIAGNOSIS — E669 Obesity, unspecified: Secondary | ICD-10-CM | POA: Diagnosis not present

## 2016-07-17 DIAGNOSIS — M5431 Sciatica, right side: Secondary | ICD-10-CM | POA: Diagnosis not present

## 2016-07-17 DIAGNOSIS — Z23 Encounter for immunization: Secondary | ICD-10-CM | POA: Diagnosis not present

## 2016-07-29 MED FILL — ESCITALOPRAM 20 MG TABLET: 20 | 90 days supply | Qty: 90 | Fill #0

## 2016-08-12 MED FILL — PHENTERMINE 15 MG CAPSULE: 15 | 30 days supply | Qty: 60 | Fill #0

## 2016-08-30 DIAGNOSIS — M25522 Pain in left elbow: Secondary | ICD-10-CM | POA: Diagnosis not present

## 2016-09-13 ENCOUNTER — Other Ambulatory Visit: Payer: Self-pay

## 2016-09-13 ENCOUNTER — Telehealth: Payer: Self-pay | Admitting: Obstetrics and Gynecology

## 2016-09-13 MED ORDER — VALACYCLOVIR HCL 1 G PO TABS: 1000.0000 mg | ORAL_TABLET | Freq: Every day | ORAL | 11 refills | Status: DC

## 2016-09-13 NOTE — Telephone Encounter (Signed)
Tried calling patient back. Voice mail is full. Will try back later. -sco

## 2016-09-13 NOTE — Telephone Encounter (Signed)
Patient calling to check status of refill request.

## 2016-09-13 NOTE — Telephone Encounter (Signed)
Medication refill request: Valacyclovir Last AEX:  03/25/16 BS Next AEX: 03/05/17 BS Last MMG (if hormonal medication request): n/a Refill authorized: 04/19/16 #40 11R. Please advise. Thank you.

## 2016-10-08 MED FILL — PHENTERMINE 15 MG CAPSULE: 15 | 30 days supply | Qty: 60 | Fill #0

## 2016-11-13 MED FILL — valACYclovir HCL 1 GM TABS: 1 | 30 days supply | Qty: 36 | Fill #0

## 2016-11-21 ENCOUNTER — Ambulatory Visit: Payer: 59

## 2016-11-21 ENCOUNTER — Encounter: Payer: 59 | Admitting: Podiatry

## 2016-11-21 DIAGNOSIS — M2042 Other hammer toe(s) (acquired), left foot: Secondary | ICD-10-CM

## 2016-11-21 NOTE — Progress Notes (Signed)
This encounter was created in error - please disregard.

## 2016-11-25 ENCOUNTER — Telehealth: Payer: Self-pay | Admitting: Obstetrics and Gynecology

## 2016-11-25 NOTE — Telephone Encounter (Signed)
Patient left voicemail that her vagina is "very itchy, completely swollen and it burns and has a discharge and not able to control it."

## 2016-11-25 NOTE — Telephone Encounter (Signed)
Left message to call Jackie Davis at [phone].  

## 2016-11-25 NOTE — Telephone Encounter (Signed)
Spoke with patient. Patient states she has been experiencing vaginal itching, swelling, discomfort, white discharge and odor since last week. Patient states got better for few days then worsened. Patient states she has been using cortisone cream for the itching. Patient denies urinary symptoms, reports urine burns irritated skin. Patient states she thought maybe was a breakout and increased valacyclovir to twice a day for 3 days with no improvement. Patient reports she has a new partner. Recommended OV for further evaluation, advised patient Dr. Edward JollySilva out of the office 2/13 can schedule with covering provider. Patient scheduled 2/13 at 10:15am with Jackie CommentPatricia Grubb, NP. Patient asking if anything can do for comfort , advised Aveeno sitz bath for comfort. Patient verbalizes understanding and is agreeable.  Routing to provider for final review. Patient is agreeable to disposition. Will close encounter.  Cc: Jackie CommentPatricia Grubb, NP

## 2016-11-26 ENCOUNTER — Ambulatory Visit: Payer: Self-pay | Admitting: Nurse Practitioner

## 2016-11-26 ENCOUNTER — Ambulatory Visit (INDEPENDENT_AMBULATORY_CARE_PROVIDER_SITE_OTHER): Payer: 59 | Admitting: Nurse Practitioner

## 2016-11-26 ENCOUNTER — Encounter: Payer: Self-pay | Admitting: Nurse Practitioner

## 2016-11-26 VITALS — BP 108/70 | HR 62 | Resp 12 | Ht 69.0 in | Wt 199.6 lb

## 2016-11-26 DIAGNOSIS — Z113 Encounter for screening for infections with a predominantly sexual mode of transmission: Secondary | ICD-10-CM | POA: Diagnosis not present

## 2016-11-26 DIAGNOSIS — N760 Acute vaginitis: Secondary | ICD-10-CM

## 2016-11-26 MED ORDER — FLUCONAZOLE 150 MG PO TABS: 150.0000 mg | ORAL_TABLET | Freq: Once | ORAL | 1 refills | Status: DC

## 2016-11-26 MED FILL — FLUCONAZOLE 150 MG TABLET: 150 | 3 days supply | Qty: 2 | Fill #0

## 2016-11-26 NOTE — Progress Notes (Signed)
41 y.o. Legally separated Latino female G1P1001 here with complaint of experiencing vaginal itching, swelling, discomfort, white discharge and odor since last week. Patient states got better for few days then worsened. Patient states she has been using cortisone cream for the itching since yesterday with slight help. Patient denies urinary symptoms, reports urine burns irritated skin. Patient states she thought maybe was a breakout of HSV and increased Valacyclovir to twice a day for 3 days with no improvement. Patient reports she has a new partner 2 weeks ago before all these symptoms started. Some odor.  Onset of symptoms 2 weeks ago. Denies new personal products or vaginal dryness.  STD concerns as this was a new partner.  She has not been in contact with him to see if he has any symptoms.   Contraception is Mirena IUD inserted 04/19/2016.   O:  Healthy female WDWN Affect: normal, orientation x 3  Exam: no distress Abdomen: soft and non tender Lymph node: no enlargement or tenderness Pelvic exam: External genital: normal female with erythema and areas of HSV that are healing BUS: negative Vagina: thick yellow discharge noted. But also red vaginal tissue that maybe present if Trich infection.   Affirm taken. Cervix: normal, non tender, no CMT, IUD strings are barely visible Uterus: normal, non tender Adnexa:normal, non tender, no masses or fullness noted   A: R/O Vaginitis, history of chronic yeast in the past  R/O STD  Mirena IUD for contraception 04/19/2016  Legally separated and new partner  History of HSV with recent flare  P: Discussed findings of vaginitis and etiology. Discussed Aveeno or baking soda sitz bath for comfort. Avoid moist clothes or pads for extended period of time. If working out in gym clothes or swim suits for long periods of time change underwear or bottoms of swimsuit if possible. Olive Oil/Coconut Oil use for skin protection prior to activity can be used to external  skin.  Rx: she is given Diflucan because of the intensity of vaginal itching  She will remain on Valtrex for a few more days to make sure HSV is cleared.  Follow with Affirm, GC, CHL, STD panel  RV prn

## 2016-11-26 NOTE — Patient Instructions (Signed)

## 2016-11-26 NOTE — Progress Notes (Deleted)
Patient ID: Thompson GrayerCristina Ruas de Melo, female   DOB: 1976-09-10, 41 y.o.   MRN: [id number]  41 y.o. Married Hispanic female G1P1001 here with complaint of experiencing vaginal itching, swelling, discomfort, white discharge and odor since last week. Patient states got better for few days then worsened. Patient states she has been using cortisone cream for the itching. Patient denies urinary symptoms, reports urine burns irritated skin. Patient states she thought maybe was a breakout and increased valacyclovir to twice a day for 3 days with no improvement. Patient reports she has a new partner. Describes discharge as ***. Denies new personal products or vaginal dryness. STD concerns with new partner for.   Contraception is Mirena IUD inserted on 04/19/2016.   O:  Healthy female WDWN Affect: normal, orientation x 3  Exam: Abdomen: Lymph node: no enlargement or tenderness Pelvic exam: External genital: normal female BUS: negative Vagina: *** discharge noted.  Affirm taken. Cervix: normal, non tender, no CMT Uterus: normal, non tender Adnexa:normal, non tender, no masses or fullness noted    A: Vaginitis   P: Discussed findings of vaginitis and etiology. Discussed Aveeno or baking soda sitz bath for comfort. Avoid moist clothes or pads for extended period of time. If working out in gym clothes or swim suits for long periods of time change underwear or bottoms of swimsuit if possible. Olive Oil/Coconut Oil use for skin protection prior to activity can be used to external skin.  Rx: ***  Follow with Affirm  Rv prn

## 2016-11-27 LAB — HEPATITIS C ANTIBODY: HCV AB: NEGATIVE

## 2016-11-27 LAB — WET PREP BY MOLECULAR PROBE
CANDIDA SPECIES: NEGATIVE
Gardnerella vaginalis: POSITIVE — AB
Trichomonas vaginosis: NEGATIVE

## 2016-11-27 LAB — GC/CHLAMYDIA PROBE AMP
CT PROBE, AMP APTIMA: NOT DETECTED
GC Probe RNA: NOT DETECTED

## 2016-11-27 LAB — STD PANEL
HEP B S AG: NEGATIVE
HIV 1&2 Ab, 4th Generation: NONREACTIVE

## 2016-11-27 MED ORDER — METRONIDAZOLE 0.75 % VA GEL: 1.0000 | Freq: Every day | VAGINAL | 0 refills | Status: DC

## 2016-11-27 MED FILL — VANDAZOLE VAGINAL 0.75% GEL: 0.75 | 7 days supply | Qty: 70 | Fill #0

## 2016-11-28 DIAGNOSIS — L700 Acne vulgaris: Secondary | ICD-10-CM | POA: Diagnosis not present

## 2016-11-28 DIAGNOSIS — L718 Other rosacea: Secondary | ICD-10-CM | POA: Diagnosis not present

## 2016-11-28 MED FILL — DOXYCYCLINE HYCLATE 100 MG: 100 | 14 days supply | Qty: 14 | Fill #0

## 2016-11-28 MED FILL — metroNIDAZOLE 0.75 % CREA: 0.75 | 30 days supply | Qty: 45 | Fill #0

## 2016-12-01 NOTE — Progress Notes (Signed)
Encounter reviewed by Dr. Brook Amundson C. Silva.  

## 2016-12-03 MED FILL — TRETINOIN 0.025% CREAM: 0.025 | 30 days supply | Qty: 45 | Fill #0

## 2016-12-09 MED FILL — PHENTERMINE 15 MG CAPSULE: 15 | 30 days supply | Qty: 60 | Fill #0

## 2016-12-18 ENCOUNTER — Encounter: Payer: Self-pay | Admitting: Obstetrics and Gynecology

## 2016-12-18 ENCOUNTER — Ambulatory Visit (INDEPENDENT_AMBULATORY_CARE_PROVIDER_SITE_OTHER): Payer: 59 | Admitting: Obstetrics and Gynecology

## 2016-12-18 ENCOUNTER — Telehealth: Payer: Self-pay | Admitting: Obstetrics and Gynecology

## 2016-12-18 VITALS — BP 108/64 | HR 70 | Ht 69.0 in | Wt 198.2 lb

## 2016-12-18 DIAGNOSIS — F5231 Female orgasmic disorder: Secondary | ICD-10-CM | POA: Diagnosis not present

## 2016-12-18 NOTE — Telephone Encounter (Signed)
Spoke with patient. Patient states she believes she has "orgasmic dysfunction" and would like to discuss with Dr. Edward JollySilva. Patient states she has been reading online there are devices such as "Eros" that can be prescribed to help. Patient states she did not mention this at last OV with Ria CommentPatricia Grubb, NP. Patient scheduled for today, 3/7 at 3pm with Dr. Edward JollySilva. Patient is agreeable to date and time.  Routing to provider for final review. Patient is agreeable to disposition. Will close encounter.

## 2016-12-18 NOTE — Progress Notes (Signed)
GYNECOLOGY  VISIT   HPI: 41 y.o.   Legally Separated  Latino  female   G1P1001 with No LMP recorded. Patient is not currently having periods (Reason: IUD).   here for consult regarding" orgasmic dysfunction".  States she can achieve orgasm with masturbation or with use of a vibrator.  Has never been able to achieve orgasm with a partner.   Separated and has a new partner.   Had recent bacterial vaginosis.  Was able to have orgasm when she had this inflammation present.   Had labioplasty at age 41 or [age] in [place]. No prior painful sexual experiences emotionally or physically.   On Lexpro for 4 years.  Not using Xanax.   GYNECOLOGIC HISTORY: No LMP recorded. Patient is not currently having periods (Reason: IUD). Contraception:  Mirena IUD inserted 04-19-16 Menopausal hormone therapy:  n/a Last mammogram:  Never Last pap smear:   2016 normal per patient        OB History    Gravida Para Term Preterm AB Living   1 1 1     1    SAB TAB Ectopic Multiple Live Births           1         Patient Active Problem List   Diagnosis Date Noted  . PCOS (polycystic ovarian syndrome) 06/23/2014  . Right knee pain 02/10/2014  . Weight gain 11/29/2013  . POSITIVE PPD 12/01/2009  . CRUSHING INJURY, FINGER 12/01/2009    Past Medical History:  Diagnosis Date  . ADD (attention deficit disorder)   . Anxiety   . Hypercholesterolemia   . Positive PPD   . STD (sexually transmitted disease)    Hx HSV II  . Vitamin D deficiency     Past Surgical History:  Procedure Laterality Date  . labial reduction     vulvar surgery  . LIPOSUCTION      Current Outpatient Prescriptions  Medication Sig Dispense Refill  . ALPRAZolam (XANAX) 0.25 MG tablet Take 0.25 mg by mouth as needed for anxiety.    Marland Kitchen. doxycycline (VIBRA-TABS) 100 MG tablet Take 1 tablet by mouth daily.  0  . escitalopram (LEXAPRO) 10 MG tablet Take 1 tablet by mouth 2 (two) times daily.  12  . phentermine 15 MG capsule   0  .  valACYclovir (VALTREX) 1000 MG tablet Take 1 tablet (1,000 mg total) by mouth daily. Take one tablet twice a day for 3 days prn infection. 40 tablet 11   No current facility-administered medications for this visit.      ALLERGIES: Patient has no known allergies.  Family History  Problem Relation Age of Onset  . Diabetes Father   . Heart disease Father   . Healthy Mother   . Hyperlipidemia Mother   . Cancer Maternal Uncle 5950    Dec Glyoblastoma age 10858  . Hyperlipidemia Maternal Grandmother   . Healthy Other     Sibling  . Hypertension Other     Social History   Social History  . Marital status: Legally Separated    Spouse name: N/A  . Number of children: 1  . Years of education: N/A   Occupational History  . masters education    Social History Main Topics  . Smoking status: Never Smoker  . Smokeless tobacco: Never Used  . Alcohol use No  . Drug use: No  . Sexual activity: Yes    Partners: Male     Comment: Mirena inserted 04-19-16   Other  Topics Concern  . Not on file   Social History Narrative   Spouse of Dr. Marchelle Gearing    ROS:  Pertinent items are noted in HPI.  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   PLAN  We reviewed stresses in her life, moving to a new country, separation and new partner. We talked about sexual response.  She is interested in a referral to [facility] for further evaluation and treatment.  She is given information about his group. I will make this referral for her and have it faxed in.   An After Visit Summary was printed and given to the patient.  _25_____ minutes face to face time of which over 50% was spent in counseling.

## 2016-12-18 NOTE — Telephone Encounter (Signed)
Patient would like to speak with someone about "orgasmic dysfunction"

## 2016-12-19 ENCOUNTER — Encounter: Payer: Self-pay | Admitting: Obstetrics and Gynecology

## 2017-01-01 MED FILL — ADDERALL XR 5 MG CAPSULE SA: 5 | 30 days supply | Qty: 30 | Fill #0

## 2017-01-01 MED FILL — ESCITALOPRAM 20 MG TABLET: 20 | 90 days supply | Qty: 90 | Fill #1

## 2017-01-15 MED FILL — VYVANSE 30 MG CAPSULE: 30 | 15 days supply | Qty: 15 | Fill #0

## 2017-01-15 MED FILL — VYVANSE 20 MG CAPSULE: 20 | 15 days supply | Qty: 15 | Fill #0

## 2017-02-03 MED FILL — VYVANSE 20 MG CAPSULE: 20 | 15 days supply | Qty: 15 | Fill #0

## 2017-02-03 MED FILL — VYVANSE 30 MG CAPSULE: 30 | 15 days supply | Qty: 15 | Fill #0

## 2017-02-17 MED FILL — VYVANSE 70 MG CAPSULE: 70 | 30 days supply | Qty: 30 | Fill #0

## 2017-03-03 MED FILL — VYVANSE 30 MG CAPSULE: 30 | 30 days supply | Qty: 30 | Fill #0

## 2017-03-05 ENCOUNTER — Ambulatory Visit: Payer: 59 | Admitting: Obstetrics and Gynecology

## 2017-03-05 ENCOUNTER — Encounter: Payer: Self-pay | Admitting: Obstetrics and Gynecology

## 2017-03-05 NOTE — Progress Notes (Deleted)
41 y.o. G58P1001 Legally Separated Latin female here for annual exam.    PCP:   Pearson Grippe, MD  No LMP recorded. Patient is not currently having periods (Reason: IUD).           Sexually active: {yes no:314532}  The current method of family planning is {contraception:315051}.    Exercising: {yes no:314532}  {types:19826} Smoker:  no  Health Maintenance: Pap:  ?2016 normal per patient;unsure of HR HPV testing History of abnormal Pap:  {YES NO:22349} MMG:  Never Colonoscopy:  n/a BMD:   n/a  Result  n/a TDaP:  *** Gardasil:   n/a HIV: 11/26/16 Negative Hep C: 11/26/16 Negative Screening Labs:  Hb today: ***, Urine today: ***   reports that she has never smoked. She has never used smokeless tobacco. She reports that she does not drink alcohol or use drugs.  Past Medical History:  Diagnosis Date  . ADD (attention deficit disorder)   . Anxiety   . Hypercholesterolemia   . Positive PPD   . STD (sexually transmitted disease)    Hx HSV II  . Vitamin D deficiency     Past Surgical History:  Procedure Laterality Date  . labial reduction     vulvar surgery  . LIPOSUCTION      Current Outpatient Prescriptions  Medication Sig Dispense Refill  . ALPRAZolam (XANAX) 0.25 MG tablet Take 0.25 mg by mouth as needed for anxiety.    Marland Kitchen doxycycline (VIBRA-TABS) 100 MG tablet Take 1 tablet by mouth daily.  0  . escitalopram (LEXAPRO) 10 MG tablet Take 1 tablet by mouth 2 (two) times daily.  12  . phentermine 15 MG capsule   0  . valACYclovir (VALTREX) 1000 MG tablet Take 1 tablet (1,000 mg total) by mouth daily. Take one tablet twice a day for 3 days prn infection. 40 tablet 11   No current facility-administered medications for this visit.     Family History  Problem Relation Age of Onset  . Diabetes Father   . Heart disease Father   . Healthy Mother   . Hyperlipidemia Mother   . Cancer Maternal Uncle 82       Dec Glyoblastoma age 40  . Hyperlipidemia Maternal Grandmother   .  Healthy Other        Sibling  . Hypertension Other     ROS:  Pertinent items are noted in HPI.  Otherwise, a comprehensive ROS was negative.  Exam:   There were no vitals taken for this visit.    General appearance: alert, cooperative and appears stated age Head: Normocephalic, without obvious abnormality, atraumatic Neck: no adenopathy, supple, symmetrical, trachea midline and thyroid normal to inspection and palpation Lungs: clear to auscultation bilaterally Breasts: normal appearance, no masses or tenderness, No nipple retraction or dimpling, No nipple discharge or bleeding, No axillary or supraclavicular adenopathy Heart: regular rate and rhythm Abdomen: soft, non-tender; no masses, no organomegaly Extremities: extremities normal, atraumatic, no cyanosis or edema Skin: Skin color, texture, turgor normal. No rashes or lesions Lymph nodes: Cervical, supraclavicular, and axillary nodes normal. No abnormal inguinal nodes palpated Neurologic: Grossly normal  Pelvic: External genitalia:  no lesions              Urethra:  normal appearing urethra with no masses, tenderness or lesions              Bartholins and Skenes: normal                 Vagina:  normal appearing vagina with normal color and discharge, no lesions              Cervix: no lesions              Pap taken: {yes no:314532} Bimanual Exam:  Uterus:  normal size, contour, position, consistency, mobility, non-tender              Adnexa: no mass, fullness, tenderness              Rectal exam: {yes no:314532}.  Confirms.              Anus:  normal sphincter tone, no lesions  Chaperone was present for exam.  Assessment:   Well woman visit with normal exam.   Plan: Mammogram screening discussed. Recommended self breast awareness. Pap and HR HPV as above. Guidelines for Calcium, Vitamin D, regular exercise program including cardiovascular and weight bearing exercise.   Follow up annually and prn.   Additional  counseling given.  {yes [id number]:314532}. _______ minutes face to face time of which over 50% was spent in counseling.    After visit summary provided.

## 2017-03-25 DIAGNOSIS — F900 Attention-deficit hyperactivity disorder, predominantly inattentive type: Secondary | ICD-10-CM | POA: Diagnosis not present

## 2017-03-25 DIAGNOSIS — F321 Major depressive disorder, single episode, moderate: Secondary | ICD-10-CM | POA: Diagnosis not present

## 2017-03-26 MED FILL — VYVANSE 20 MG CHEW: 20 | 30 days supply | Qty: 30 | Fill #0

## 2017-03-26 MED FILL — VYVANSE 70 MG CAPSULE: 70 | 30 days supply | Qty: 30 | Fill #0

## 2017-04-15 ENCOUNTER — Telehealth: Payer: Self-pay | Admitting: Obstetrics and Gynecology

## 2017-04-15 ENCOUNTER — Ambulatory Visit (INDEPENDENT_AMBULATORY_CARE_PROVIDER_SITE_OTHER): Payer: 59 | Admitting: Obstetrics & Gynecology

## 2017-04-15 ENCOUNTER — Encounter: Payer: Self-pay | Admitting: Obstetrics & Gynecology

## 2017-04-15 VITALS — BP 110/80 | HR 82 | Resp 14 | Ht 69.0 in | Wt 201.1 lb

## 2017-04-15 DIAGNOSIS — N898 Other specified noninflammatory disorders of vagina: Secondary | ICD-10-CM | POA: Diagnosis not present

## 2017-04-15 MED ORDER — FLUCONAZOLE 150 MG PO TABS: ORAL_TABLET | ORAL | 0 refills | Status: DC

## 2017-04-15 MED ORDER — METRONIDAZOLE 500 MG PO TABS
500.0000 mg | ORAL_TABLET | Freq: Two times a day (BID) | ORAL | 0 refills | Status: DC
Start: 2017-04-15 — End: 2019-09-06

## 2017-04-15 NOTE — Telephone Encounter (Signed)
Spoke with patient. Patient states that she has been experiencing vaginal itching, swelling, and discomfort for a couple of days. Has taken valtrex as she was unsure if she was starting to have an outbreak without relief. No change in partners. Advised she will need to be seen for further evaluation. Appointment scheduled for 04/15/2017 at 3:30 pm with Dr.Miller. Patient is agreeable to date and time.  Routing to provider for final review. Patient agreeable to disposition. Will close encounter.

## 2017-04-15 NOTE — Telephone Encounter (Signed)
Patient has a yeast infection and would like an appointment today. No appointments available today.

## 2017-04-15 NOTE — Telephone Encounter (Signed)
Left message to call [name] at [phone]. 

## 2017-04-15 NOTE — Progress Notes (Signed)
GYNECOLOGY  VISIT   HPI: 41 y.o. 821P1001 Legally Separated [place] female here for complaint of increased vaginal discharge and itching.  This has been going on for two or three days.  She does have a hx of HSV and she's not sure if this is it.  She has taken Valtrex three times daily for the past two days and this hasn't helped.  Also, she's not having any pain so that doesn't seem like HSV to her either.  The itching is sometime present at the beginning of an HSV outbreak.  Pt also reports she's used topical steroid and oral doxycycline (some left over that she had at home).  The steroid seemed to have helped the most.  She was diagnosed with BV in February.  Full STD testing was done then and was negative.  She is with same sexual partner.  Denies irregular bleeding.  Denies urinary symptoms.  Denies pelvic pain or fever.   GYNECOLOGIC HISTORY: No LMP recorded. Patient is not currently having periods (Reason: IUD). Contraception: IUD  Patient Active Problem List   Diagnosis Date Noted  . PCOS (polycystic ovarian syndrome) 06/23/2014  . Right knee pain 02/10/2014  . Weight gain 11/29/2013  . POSITIVE PPD 12/01/2009  . CRUSHING INJURY, FINGER 12/01/2009    Past Medical History:  Diagnosis Date  . ADD (attention deficit disorder)   . Anxiety   . Hypercholesterolemia   . Positive PPD   . STD (sexually transmitted disease)    Hx HSV II  . Vitamin D deficiency     Past Surgical History:  Procedure Laterality Date  . labial reduction     vulvar surgery  . LIPOSUCTION      MEDS:  Reviewed in EPIC and UTD  ALLERGIES: Patient has no known allergies.  Family History  Problem Relation Age of Onset  . Diabetes Father   . Heart disease Father   . Healthy Mother   . Hyperlipidemia Mother   . Cancer Maternal Uncle 2650       Dec Glyoblastoma age 41  . Hyperlipidemia Maternal Grandmother   . Healthy Other        Sibling  . Hypertension Other     SH:  Separated, non  smoker  ROS  PHYSICAL EXAMINATION:    BP 110/80 (BP Location: Right Arm, Patient Position: Sitting, Cuff Size: Normal)   Pulse 82   Resp 14   Ht 5\' 9"  (1.753 m)   Wt 201 lb 1.3 oz (91.2 kg)   BMI 29.69 kg/m     General appearance: alert, cooperative and appears stated age Abdomen: soft, non-tender; bowel sounds normal; no masses,  no organomegaly  Pelvic: External genitalia:  no lesions              Urethra:  normal appearing urethra with no masses, tenderness or lesions              Bartholins and Skenes: normal                 Vagina: normal appearing vagina with whitish/creamy colored discharge, no lesions              Cervix: no lesions and IUD string noted              Bimanual Exam:  Uterus:  normal size, contour, position, consistency, mobility, non-tender              Adnexa: no mass, fullness, tenderness  Anus:   no lesions  Chaperone was present for exam.  Assessment: Vaginal discharge most consistent with yeast No HSV lesions  Plan: Vaginitis probe pending Advised not to take the valtrex any more at this time Will treat with Flagyl 500mg  bid x 7 days and diflucan 150mg  po x 1, repeat 72 hours until results are back

## 2017-04-16 LAB — VAGINITIS/VAGINOSIS, DNA PROBE
CANDIDA SPECIES: POSITIVE — AB
Gardnerella vaginalis: NEGATIVE
Trichomonas vaginosis: NEGATIVE

## 2017-04-23 MED FILL — ESCITALOPRAM 20 MG TABLET: 20 | 90 days supply | Qty: 90 | Fill #2

## 2017-05-16 MED FILL — VYVANSE 20 MG CHEW: 20 | 30 days supply | Qty: 30 | Fill #0

## 2017-05-16 MED FILL — VYVANSE 70 MG CAPSULE: 70 | 30 days supply | Qty: 30 | Fill #0

## 2017-06-25 MED FILL — VYVANSE 70 MG CAPSULE: 70 | 30 days supply | Qty: 30 | Fill #0

## 2017-07-15 DIAGNOSIS — F900 Attention-deficit hyperactivity disorder, predominantly inattentive type: Secondary | ICD-10-CM | POA: Diagnosis not present

## 2017-08-01 MED FILL — VYVANSE 70 MG CAPSULE: 70 | 30 days supply | Qty: 30 | Fill #0

## 2017-08-01 MED FILL — TRINTELLIX 10 MG TABLET: 10 | 30 days supply | Qty: 30 | Fill #0

## 2017-08-01 MED FILL — VYVANSE 20 MG CHEW: 20 | 30 days supply | Qty: 30 | Fill #0

## 2017-08-12 DIAGNOSIS — J029 Acute pharyngitis, unspecified: Secondary | ICD-10-CM | POA: Diagnosis not present

## 2017-08-12 MED FILL — AZITHROMYCIN 250 MG TAB: 250 | 5 days supply | Qty: 6 | Fill #0

## 2017-09-02 MED FILL — VYVANSE 70 MG CAPSULE: 70 | 30 days supply | Qty: 30 | Fill #0

## 2017-09-15 MED FILL — TRINTELLIX 10 MG TABLET: 10 | 30 days supply | Qty: 30 | Fill #0

## 2017-09-19 DIAGNOSIS — Z23 Encounter for immunization: Secondary | ICD-10-CM | POA: Diagnosis not present

## 2017-10-08 MED FILL — VYVANSE 20 MG CHEW: 20 | 30 days supply | Qty: 30 | Fill #0

## 2017-10-08 MED FILL — VYVANSE 70 MG CAPSULE: 70 | 30 days supply | Qty: 30 | Fill #0

## 2017-11-04 DIAGNOSIS — H52223 Regular astigmatism, bilateral: Secondary | ICD-10-CM | POA: Diagnosis not present

## 2017-11-04 DIAGNOSIS — H5213 Myopia, bilateral: Secondary | ICD-10-CM | POA: Diagnosis not present

## 2017-11-20 MED FILL — VYVANSE 20 MG CHEW: 20 | 30 days supply | Qty: 30 | Fill #0

## 2017-11-20 MED FILL — VYVANSE 70 MG CAPSULE: 70 | 30 days supply | Qty: 30 | Fill #0

## 2017-12-16 MED FILL — VYVANSE 70 MG CAPSULE: 70 | 30 days supply | Qty: 30 | Fill #0

## 2017-12-16 MED FILL — VYVANSE 20 MG CHEW: 20 | 30 days supply | Qty: 30 | Fill #0

## 2018-01-21 MED FILL — VYVANSE 20 MG CHEW: 20 | 30 days supply | Qty: 30 | Fill #0

## 2018-01-21 MED FILL — TRINTELLIX 10 MG TABLET: 10 | 30 days supply | Qty: 30 | Fill #0

## 2018-01-21 MED FILL — VYVANSE 70 MG CAPSULE: 70 | 30 days supply | Qty: 30 | Fill #0

## 2018-01-23 ENCOUNTER — Telehealth: Payer: Self-pay | Admitting: Obstetrics and Gynecology

## 2018-01-23 MED ORDER — VALACYCLOVIR HCL 500 MG PO TABS: 500.0000 mg | ORAL_TABLET | Freq: Two times a day (BID) | ORAL | 0 refills | Status: DC

## 2018-01-23 MED FILL — VALACYCLOVIR HCL 500 MG TAB: 500 | 7 days supply | Qty: 15 | Fill #0

## 2018-01-23 NOTE — Telephone Encounter (Signed)
Rx as seen below per Dr. Edward JollySilva to pharmacy on file, call returned to patient. Patient verbalizes understanding and is agreeable, will close encounter.

## 2018-01-23 NOTE — Telephone Encounter (Signed)
Patient called requesting refill for Valtrex. Patient stated that she is unsure if Dr. Edward JollySilva was the doctor who prescribed it in the past.

## 2018-01-23 NOTE — Telephone Encounter (Signed)
Valtrex 500 mg po bid x 3 days prn.  Dispense:  15, RF none.

## 2018-01-23 NOTE — Telephone Encounter (Signed)
Spoke with patient, hx of HSV2, reports current outbreak, requesting refill of Valtrex.   Confirmed pharmacy on file. Advised will review with Dr. Edward JollySilva and return call.  Advised patient she is overdue for AEX, last AEX 02/22/16 Dr. Edward JollySilva. AEX scheduled for 02/09/18 at 8:30am with Dr. Edward JollySilva.   Dr. Edward JollySilva -please advise on valtrex refill.

## 2018-02-09 ENCOUNTER — Ambulatory Visit: Payer: Self-pay | Admitting: Obstetrics and Gynecology

## 2018-02-09 ENCOUNTER — Encounter: Payer: Self-pay | Admitting: Obstetrics and Gynecology

## 2018-02-09 NOTE — Progress Notes (Deleted)
42 y.o. G1P1001 Legally Separated [place] female here for annual exam.    PCP:     No LMP recorded. (Menstrual status: IUD).           Sexually active: {yes no:314532}  The current method of family planning is {contraception:315051}.    Exercising: {yes no:314532}  {types:19826} Smoker:  no  Health Maintenance: Pap:  *** History of abnormal Pap:  {YES NO:22349} MMG:  *** Colonoscopy:  *** BMD:   ***  Result  *** TDaP:  *** Gardasil:   {YES NO:22349} HIV and Hep C: 11/26/16 Negative Screening Labs:  Hb today: ***, Urine today: ***   reports that she has never smoked. She has never used smokeless tobacco. She reports that she does not drink alcohol or use drugs.  Past Medical History:  Diagnosis Date  . ADD (attention deficit disorder)   . Anxiety   . Hypercholesterolemia   . Positive PPD   . STD (sexually transmitted disease)    Hx HSV II  . Vitamin D deficiency     Past Surgical History:  Procedure Laterality Date  . labial reduction     vulvar surgery  . LIPOSUCTION      Current Outpatient Medications  Medication Sig Dispense Refill  . ALPRAZolam (XANAX) 0.25 MG tablet Take 0.25 mg by mouth as needed for anxiety.    Marland Kitchen escitalopram (LEXAPRO) 10 MG tablet Take 1 tablet by mouth 2 (two) times daily.  12  . fluconazole (DIFLUCAN) 150 MG tablet Take one tablet.  Repeat in 72 hours if symptoms are not completely resolved. 2 tablet 0  . metroNIDAZOLE (FLAGYL) 500 MG tablet Take 1 tablet (500 mg total) by mouth 2 (two) times daily. 14 tablet 0  . valACYclovir (VALTREX) 500 MG tablet Take 1 tablet (500 mg total) by mouth 2 (two) times daily. x3 days prn 15 tablet 0  . VYVANSE 70 MG capsule      No current facility-administered medications for this visit.     Family History  Problem Relation Age of Onset  . Diabetes Father   . Heart disease Father   . Healthy Mother   . Hyperlipidemia Mother   . Cancer Maternal Uncle 91       Dec Glyoblastoma age 3  .  Hyperlipidemia Maternal Grandmother   . Healthy Other        Sibling  . Hypertension Other     Review of Systems  Exam:   There were no vitals taken for this visit.    General appearance: alert, cooperative and appears stated age Head: Normocephalic, without obvious abnormality, atraumatic Neck: no adenopathy, supple, symmetrical, trachea midline and thyroid normal to inspection and palpation Lungs: clear to auscultation bilaterally Breasts: normal appearance, no masses or tenderness, No nipple retraction or dimpling, No nipple discharge or bleeding, No axillary or supraclavicular adenopathy Heart: regular rate and rhythm Abdomen: soft, non-tender; no masses, no organomegaly Extremities: extremities normal, atraumatic, no cyanosis or edema Skin: Skin color, texture, turgor normal. No rashes or lesions Lymph nodes: Cervical, supraclavicular, and axillary nodes normal. No abnormal inguinal nodes palpated Neurologic: Grossly normal  Pelvic: External genitalia:  no lesions              Urethra:  normal appearing urethra with no masses, tenderness or lesions              Bartholins and Skenes: normal                 Vagina:  normal appearing vagina with normal color and discharge, no lesions              Cervix: no lesions              Pap taken: {yes no:314532} Bimanual Exam:  Uterus:  normal size, contour, position, consistency, mobility, non-tender              Adnexa: no mass, fullness, tenderness              Rectal exam: {yes no:314532}.  Confirms.              Anus:  normal sphincter tone, no lesions  Chaperone was present for exam.  Assessment:   Well woman visit with normal exam.   Plan: Mammogram screening. Recommended self breast awareness. Pap and HR HPV as above. Guidelines for Calcium, Vitamin D, regular exercise program including cardiovascular and weight bearing exercise.   Follow up annually and prn.   Additional counseling given.  {yes [id number]. _______  minutes face to face time of which over 50% was spent in counseling.    After visit summary provided.

## 2018-02-19 DIAGNOSIS — M5441 Lumbago with sciatica, right side: Secondary | ICD-10-CM | POA: Diagnosis not present

## 2018-02-20 ENCOUNTER — Other Ambulatory Visit: Payer: Self-pay | Admitting: Internal Medicine

## 2018-02-20 DIAGNOSIS — M5441 Lumbago with sciatica, right side: Secondary | ICD-10-CM

## 2018-03-04 ENCOUNTER — Other Ambulatory Visit: Payer: 59

## 2018-03-19 MED FILL — VYVANSE 20 MG CHEW: 20 | 30 days supply | Qty: 30 | Fill #0

## 2018-04-07 MED FILL — TRINTELLIX 10 MG TABLET: 10 | 30 days supply | Qty: 30 | Fill #0

## 2018-04-07 MED FILL — VYVANSE 70 MG CAPSULE: 70 | 30 days supply | Qty: 30 | Fill #0

## 2018-07-21 MED FILL — TRINTELLIX 10 MG TABLET: 10 | 30 days supply | Qty: 30 | Fill #0

## 2018-07-24 DIAGNOSIS — F900 Attention-deficit hyperactivity disorder, predominantly inattentive type: Secondary | ICD-10-CM | POA: Diagnosis not present

## 2018-07-24 MED FILL — CONCERTA 36 MG TABLET ER: 36 | 30 days supply | Qty: 60 | Fill #0

## 2018-07-24 MED FILL — TRINTELLIX 20 MG TABLET: 20 | 30 days supply | Qty: 30 | Fill #0

## 2018-09-01 MED FILL — CONCERTA 36 MG TABLET ER: 36 | 30 days supply | Qty: 60 | Fill #0

## 2018-09-25 ENCOUNTER — Other Ambulatory Visit: Payer: Self-pay | Admitting: Obstetrics and Gynecology

## 2018-09-25 NOTE — Telephone Encounter (Signed)
Medication refill request: Valtrex 1000mg   Last AEX:  11/26/16 Next AEX: nothing scheduled  Last MMG (if hormonal medication request): none Refill authorized: #15 with 0 RF

## 2018-10-13 MED FILL — TRINTELLIX 20 MG TABLET: 20 | 30 days supply | Qty: 30 | Fill #0

## 2018-10-13 MED FILL — CONCERTA 36 MG TABLET ER: 36 | 30 days supply | Qty: 60 | Fill #0

## 2018-11-27 MED FILL — CONCERTA 36 MG TABLET ER: 36 | 30 days supply | Qty: 60 | Fill #0

## 2018-12-17 MED FILL — TRINTELLIX 20 MG TABLET: 20 | 30 days supply | Qty: 30 | Fill #0

## 2019-02-18 MED FILL — CONCERTA 36 MG TABLET ER: 36 | 30 days supply | Qty: 60 | Fill #0

## 2019-02-22 MED FILL — TRINTELLIX 20 MG TABLET: 20 | 30 days supply | Qty: 30 | Fill #1

## 2019-04-29 MED FILL — CONCERTA 36 MG TABLET ER: 36 | 30 days supply | Qty: 60 | Fill #0

## 2019-05-06 MED FILL — TRINTELLIX 20 MG TABLET: 20 | 30 days supply | Qty: 30 | Fill #2

## 2019-07-06 MED FILL — TRINTELLIX 20 MG TABLET: 20 | 30 days supply | Qty: 30 | Fill #3

## 2019-07-06 MED FILL — CONCERTA 36 MG TABLET ER: 36 | 30 days supply | Qty: 60 | Fill #0

## 2019-08-17 ENCOUNTER — Other Ambulatory Visit: Payer: Self-pay | Admitting: Obstetrics and Gynecology

## 2019-08-17 NOTE — Telephone Encounter (Signed)
Medication refill request: Valtrex Last OV:   04/15/17 Next AEX: nothing booked  Last MMG (if hormonal medication request): none Refill authorized: 15 tab 0 rf

## 2019-08-20 DIAGNOSIS — F900 Attention-deficit hyperactivity disorder, predominantly inattentive type: Secondary | ICD-10-CM | POA: Diagnosis not present

## 2019-09-03 ENCOUNTER — Telehealth: Payer: Self-pay | Admitting: Obstetrics and Gynecology

## 2019-09-03 NOTE — Telephone Encounter (Signed)
Spoke with pt. Pt wants refill for Valtrex d/t having current sx. Wants to start taking everyday instead of waiting for sx. Pt willing to do web visit. Please advise.    Med refill request: Valtrex  Last AEX: 02/22/2016, last OV 04/15/2017, no show appt 02/09/2018 Next AEX: none scheduled  Last MMG (if hormonal med)  Refill authorized:???

## 2019-09-03 NOTE — Telephone Encounter (Signed)
Patient needs an office visit for a prescription.

## 2019-09-03 NOTE — Telephone Encounter (Signed)
Patient is asking for a new prescription for valtrex. Patient has not been seen since 2018 and is aware she may need to be seen. She asked if she could have a virtual appointment? [place].

## 2019-09-03 NOTE — Telephone Encounter (Signed)
Called back to pt. Pt agrees to OV for med refill for Valtrex. Pt scheduled OV 09/06/19 at 10am. Covid screening negative.   Routing to provider for final review. Patient is agreeable to disposition. Will close encounter.

## 2019-09-06 ENCOUNTER — Other Ambulatory Visit: Payer: Self-pay

## 2019-09-06 ENCOUNTER — Ambulatory Visit (INDEPENDENT_AMBULATORY_CARE_PROVIDER_SITE_OTHER): Payer: Managed Care, Other (non HMO) | Admitting: Obstetrics and Gynecology

## 2019-09-06 ENCOUNTER — Other Ambulatory Visit (HOSPITAL_COMMUNITY)
Admission: RE | Admit: 2019-09-06 | Discharge: 2019-09-06 | Disposition: A | Discharge status: Home / Self Care | Payer: Managed Care, Other (non HMO) | Source: Ambulatory Visit | Attending: Obstetrics and Gynecology | Admitting: Obstetrics and Gynecology

## 2019-09-06 ENCOUNTER — Encounter: Payer: Self-pay | Admitting: Obstetrics and Gynecology

## 2019-09-06 VITALS — BP 104/70 | HR 70 | Temp 98.1°F | Ht 69.0 in | Wt 208.4 lb

## 2019-09-06 DIAGNOSIS — Z23 Encounter for immunization: Secondary | ICD-10-CM | POA: Diagnosis not present

## 2019-09-06 DIAGNOSIS — Z01419 Encounter for gynecological examination (general) (routine) without abnormal findings: Secondary | ICD-10-CM | POA: Insufficient documentation

## 2019-09-06 MED ORDER — VALACYCLOVIR HCL 500 MG PO TABS: ORAL_TABLET | ORAL | 3 refills | Status: DC

## 2019-09-06 NOTE — Patient Instructions (Signed)

## 2019-09-06 NOTE — Progress Notes (Signed)
GYNECOLOGY  VISIT   HPI: 43 y.o.   Legally Separated  [place]  female   [id number] with No LMP recorded. (Menstrual status: IUD).   here for annual exam.  She needs a refill for Valtrex.  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   Likes Mirena.  Some cramping when she would have bleeding with her menstruation.  Minimal discharge during her period.   Declines STD testing.   Working from home for [employer] from home.  She states it does not matter where her pap is sent for processing today.  GYNECOLOGIC HISTORY: No LMP recorded. (Menstrual status: IUD). Contraception:  Mirena IUD 04-19-16 Menopausal hormone therapy:  none Last mammogram:  never Last pap smear:  2016 normal per patient TDap:  PCP in [place]. Gardasil:  No. Flu vaccine:  Completed.         OB History    Gravida  1   Para  1   Term  1   Preterm      AB      Living  1     SAB      TAB      Ectopic      Multiple      Live Births  1              Patient Active Problem List   Diagnosis Date Noted  . PCOS (polycystic ovarian syndrome) 06/23/2014  . Right knee pain 02/10/2014  . Weight gain 11/29/2013  . POSITIVE PPD 12/01/2009  . CRUSHING INJURY, FINGER 12/01/2009    Past Medical History:  Diagnosis Date  . ADD (attention deficit disorder)   . Anxiety   . Hypercholesterolemia   . Positive PPD   . STD (sexually transmitted disease)    Hx HSV II  . Vitamin D deficiency     Past Surgical History:  Procedure Laterality Date  . labial reduction     vulvar surgery  . LIPOSUCTION      Current Outpatient Medications  Medication Sig Dispense Refill  . methylphenidate (CONCERTA) 36 MG PO CR tablet Take 36 mg by mouth daily. Takes 2 tablets daily    . methylphenidate 18 MG PO CR tablet Take 18 mg by mouth daily.    . TRINTELLIX 20 MG TABS tablet Take 20 mg by mouth every morning.    . valACYclovir (VALTREX) 500 MG tablet Take 1 tablet (500 mg total) by mouth 2 (two)  times daily. x3 days prn 15 tablet 0   No current facility-administered medications for this visit.      ALLERGIES: Patient has no known allergies.  Family History  Problem Relation Age of Onset  . Diabetes Father   . Heart disease Father   . Healthy Mother   . Hyperlipidemia Mother   . Cancer Maternal Uncle 50       Dec Glyoblastoma age 51  . Hyperlipidemia Maternal Grandmother   . Healthy Other        Sibling  . Hypertension Other     Social History   Socioeconomic History  . Marital status: Legally Separated    Spouse name: Not on file  . Number of children: 1  . Years of education: Not on file  . Highest education level: Not on file  Occupational History  . Occupation: masters education  Social Needs  . Financial resource strain: Not on file  . Food insecurity    Worry: Not on file    Inability: Not on  file  . Transportation needs    Medical: Not on file    Non-medical: Not on file  Tobacco Use  . Smoking status: Never Smoker  . Smokeless tobacco: Never Used  Substance and Sexual Activity  . Alcohol use: No    Alcohol/week: 0.0 standard drinks  . Drug use: No  . Sexual activity: Yes    Partners: Male    Comment: Mirena inserted 04-19-16  Lifestyle  . Physical activity    Days per week: Not on file    Minutes per session: Not on file  . Stress: Not on file  Relationships  . Social Musicianconnections    Talks on phone: Not on file    Gets together: Not on file    Attends religious service: Not on file    Active member of club or organization: Not on file    Attends meetings of clubs or organizations: Not on file    Relationship status: Not on file  . Intimate partner violence    Fear of current or ex partner: Not on file    Emotionally abused: Not on file    Physically abused: Not on file    Forced sexual activity: Not on file  Other Topics Concern  . Not on file  Social History Narrative   Spouse of Dr. Marchelle Gearingamaswamy    Review of Systems  All other  systems reviewed and are negative.   PHYSICAL EXAMINATION:    BP 104/70 (Cuff Size: Large)   Pulse 70   Temp 98.1 F (36.7 C) (Temporal)   Ht 5\' 9"  (1.753 m)   Wt 208 lb 6.4 oz (94.5 kg)   BMI 30.78 kg/m     General appearance: alert, cooperative and appears stated age Head: Normocephalic, without obvious abnormality, atraumatic Neck: no adenopathy, supple, symmetrical, trachea midline and thyroid normal to inspection and palpation Lungs: clear to auscultation bilaterally Breasts: normal appearance, no masses or tenderness, No nipple retraction or dimpling, No nipple discharge or bleeding, No axillary or supraclavicular adenopathy Heart: regular rate and rhythm Abdomen: soft, non-tender, no masses,  no organomegaly Extremities: extremities normal, atraumatic, no cyanosis or edema Skin: Skin color, texture, turgor normal. No rashes or lesions Lymph nodes: Cervical, supraclavicular, and axillary nodes normal. No abnormal inguinal nodes palpated Neurologic: Grossly normal  Pelvic: External genitalia:  no lesions              Urethra:  normal appearing urethra with no masses, tenderness or lesions              Bartholins and Skenes: normal                 Vagina: normal appearing vagina with normal color and discharge, no lesions              Cervix: no lesions.  IUD strings noted.                 Bimanual Exam:  Uterus:  normal size, contour, position, consistency, mobility, non-tender              Adnexa: no mass, fullness, tenderness              Rectal exam: Yes.  .  Confirms.              Anus:  normal sphincter tone, no lesions  Chaperone was present for exam.  ASSESSMENT  Well woman visit with normal gynecologic exam.  Hx HSV 2.  Mirena IUD.  Placed  2017. Hyperlipidemia.   PLAN  Pap and HR HPV today.  She will start Gardasil series.  She will schedule mammogram.  List of facilities/phone numbers to patient.  Breast awareness recommended.  Refill of Valtrex 500  mg daily and then twice daily for 3 days prn.  #110, RF 3.  Labs with PCP.  Calcium and vit D guidelines.  Fu yearly and prn.    An After Visit Summary was printed and given to the patient.

## 2019-09-08 LAB — CYTOLOGY - PAP
Adequacy: ABSENT
Comment: NEGATIVE
Diagnosis: NEGATIVE
High risk HPV: NEGATIVE

## 2019-11-08 ENCOUNTER — Ambulatory Visit: Payer: Managed Care, Other (non HMO)

## 2020-04-06 ENCOUNTER — Telehealth: Payer: Self-pay | Admitting: Adult Health

## 2020-04-06 NOTE — Telephone Encounter (Signed)
Please refer to PCP . Needs new PCP .  She will call back with name once she check with insurance

## 2020-06-15 DIAGNOSIS — R7309 Other abnormal glucose: Secondary | ICD-10-CM | POA: Diagnosis not present

## 2020-06-15 DIAGNOSIS — R946 Abnormal results of thyroid function studies: Secondary | ICD-10-CM | POA: Diagnosis not present

## 2020-06-15 DIAGNOSIS — Z0000 Encounter for general adult medical examination without abnormal findings: Secondary | ICD-10-CM | POA: Diagnosis not present

## 2020-06-15 DIAGNOSIS — E559 Vitamin D deficiency, unspecified: Secondary | ICD-10-CM | POA: Diagnosis not present

## 2020-06-22 DIAGNOSIS — Z0000 Encounter for general adult medical examination without abnormal findings: Secondary | ICD-10-CM | POA: Diagnosis not present

## 2020-06-22 DIAGNOSIS — K219 Gastro-esophageal reflux disease without esophagitis: Secondary | ICD-10-CM | POA: Diagnosis not present

## 2020-06-22 DIAGNOSIS — E559 Vitamin D deficiency, unspecified: Secondary | ICD-10-CM | POA: Diagnosis not present

## 2020-06-22 DIAGNOSIS — F419 Anxiety disorder, unspecified: Secondary | ICD-10-CM | POA: Diagnosis not present

## 2020-07-18 DIAGNOSIS — F900 Attention-deficit hyperactivity disorder, predominantly inattentive type: Secondary | ICD-10-CM | POA: Diagnosis not present

## 2020-08-04 ENCOUNTER — Ambulatory Visit: Payer: Managed Care, Other (non HMO)

## 2020-08-04 ENCOUNTER — Other Ambulatory Visit: Payer: Self-pay

## 2020-08-04 ENCOUNTER — Encounter: Payer: Self-pay | Admitting: Cardiology

## 2020-08-04 ENCOUNTER — Ambulatory Visit: Payer: Federal, State, Local not specified - PPO | Admitting: Cardiology

## 2020-08-04 VITALS — BP 121/78 | HR 87 | Resp 16 | Ht 69.0 in | Wt 213.0 lb

## 2020-08-04 DIAGNOSIS — E7800 Pure hypercholesterolemia, unspecified: Secondary | ICD-10-CM

## 2020-08-04 DIAGNOSIS — F988 Other specified behavioral and emotional disorders with onset usually occurring in childhood and adolescence: Secondary | ICD-10-CM

## 2020-08-04 DIAGNOSIS — R002 Palpitations: Secondary | ICD-10-CM | POA: Diagnosis not present

## 2020-08-04 NOTE — Progress Notes (Signed)
Date:  08/04/2020   ID:  Tulani Kidney, DOB 10-04-1976, MRN [id number]  PCP:  Jani Gravel, MD  Cardiologist:  Rex Kras, DO, [facility] (established care 08/04/2020)  REASON FOR CONSULT: Tachycardia  REQUESTING PHYSICIAN:  Jani Gravel, MD [place],  [place] [address]  Chief Complaint  Patient presents with  . Tachycardia  . New Patient (Initial Visit)  . Palpitations    HPI  Jackie Davis is a 44 y.o. female who presents to the office with a chief complaint of "palpitations and elevated heart rate."  No significant past cardiac history.  She is referred to the office at the request of Jani Gravel, MD for evaluation of tachycardia/palpitations.  Patient states that approximately 2 weeks ago she was at her mom's place who noted that the patient's resting heart rate was 100 bpm.  Since then she has been keeping an eye out on her pulse via the iWatch and her pulse has been ranging between 90-100.  She states 2 weeks prior to this she stopped her exercise as it was cost prohibitive she was not weaned off of them.  She states that she has had episodes of palpitations and elevated heart rates in the past which have been attributed to her underlying anxiety disorder and panic attacks.  Approximately 14 years ago when she was living I will she had seen a cardiologist for similar complaints and had undergone a Holter monitor which was reported to be normal.  I do not have those results for me to review at today's encounter.  No associated symptoms of chest pain at rest or with exercise.  When she has elevated heart rates she at times has shortness of breath.  The symptoms usually resolve spontaneously.  She denies consumption of coffee no known thyroid disease, she is not anemic, not on weight loss supplements, or illicit / herbal supplements.  She does consume 2 cans of soda per day.  And has been on Concerta of more than 1 year for ADD.  No family  history of premature coronary disease or sudden cardiac death.  Denies prior history of coronary artery disease, myocardial infarction, congestive heart failure, deep venous thrombosis, pulmonary embolism, stroke, transient ischemic attack.  FUNCTIONAL STATUS: Workouts 3-4 times a week with personal trainer and walks regularly.    ALLERGIES: No Known Allergies  MEDICATION LIST PRIOR TO VISIT: Current Meds  Medication Sig  . methylphenidate (CONCERTA) 36 MG PO CR tablet Take 36 mg by mouth daily. Takes 2 tablets daily  . valACYclovir (VALTREX) 500 MG tablet Take one tablet (500 mg) by mouth daily and then one tablet (500 mg) by mouth twice a day for 3 days as needed for an outbreak.  . Vitamin D, Ergocalciferol, (DRISDOL) 1.25 MG (50000 UNIT) CAPS capsule Take 1 capsule by mouth once a week.     PAST MEDICAL HISTORY: Past Medical History:  Diagnosis Date  . ADD (attention deficit disorder)   . Anxiety   . Hypercholesterolemia   . Positive PPD   . STD (sexually transmitted disease)    Hx HSV II  . Vitamin D deficiency     PAST SURGICAL HISTORY: Past Surgical History:  Procedure Laterality Date  . labial reduction     vulvar surgery  . LIPOSUCTION      FAMILY HISTORY: The patient family history includes Cancer (age of onset: 13) in her maternal uncle; Diabetes in her father; Healthy in her mother and another family  member; Heart disease in her father; Hyperlipidemia in her maternal grandmother and mother; Hypertension in an other family member.  SOCIAL HISTORY:  The patient  reports that she has never smoked. She has never used smokeless tobacco. She reports current alcohol use. She reports that she does not use drugs.  REVIEW OF SYSTEMS: Review of Systems  Constitutional: Negative for chills and fever.  HENT: Negative for hoarse voice and nosebleeds.   Eyes: Negative for discharge, double vision and pain.  Cardiovascular: Positive for palpitations. Negative for chest  pain, claudication, dyspnea on exertion, leg swelling, near-syncope, orthopnea, paroxysmal nocturnal dyspnea and syncope.  Respiratory: Positive for shortness of breath. Negative for hemoptysis.   Musculoskeletal: Negative for muscle cramps and myalgias.  Gastrointestinal: Negative for abdominal pain, constipation, diarrhea, hematemesis, hematochezia, melena, nausea and vomiting.  Neurological: Negative for dizziness and light-headedness.    PHYSICAL EXAM: Vitals with BMI 08/04/2020 09/06/2019 04/15/2017  Height 5' 9"  5' 9"  5' 9"   Weight 213 lbs 208 lbs 6 oz 201 lbs 1 oz  BMI 31.44 62.37 62.8  Systolic 315 176 160  Diastolic 78 70 80  Pulse 87 70 82    CONSTITUTIONAL: Well-developed and well-nourished. No acute distress.  SKIN: Skin is warm and dry. No rash noted. No cyanosis. No pallor. No jaundice HEAD: Normocephalic and atraumatic.  EYES: No scleral icterus MOUTH/THROAT: Moist oral membranes.  NECK: No JVD present. No thyromegaly noted. No carotid bruits  LYMPHATIC: No visible cervical adenopathy.  CHEST Normal respiratory effort. No intercostal retractions  LUNGS: Clear to auscultation bilaterally.  No stridor. No wheezes. No rales.  CARDIOVASCULAR: Regular rate and rhythm, positive S1-S2, no murmurs rubs or gallops appreciated ABDOMINAL: No apparent ascites.  EXTREMITIES: No peripheral edema  HEMATOLOGIC: No significant bruising NEUROLOGIC: Oriented to person, place, and time. Nonfocal. Normal muscle tone.  PSYCHIATRIC: Normal mood and affect. Normal behavior. Cooperative  CARDIAC DATABASE: EKG: 08/04/2020: Sinus  Rhythm, 75bpm, normal axis, without underlying ischemia or injury pattern.   Echocardiogram: No results found for this or any previous visit from the past 1095 days.   Stress Testing: No results found for this or any previous visit from the past 1095 days.  Heart Catheterization: None   LABORATORY DATA: External Labs: Collected: 06/15/2020 Hemoglobin: 13.5  g/dL. Creatinine 1.0 mg/dL. eGFR: 63.72 mL/min per 1.73 m Potassium 4.7 Lipid profile: Total cholesterol 225, triglycerides 144, HDL 53, LDL 146 Hemoglobin A1c: 5.6 TSH: 2.28  IMPRESSION:    ICD-10-CM   1. Palpitations  R00.2 EKG 12-Lead    LONG TERM MONITOR (3-14 DAYS)  2. Hypercholesterolemia  E78.00 PCV ECHOCARDIOGRAM COMPLETE  3. Attention deficit disorder, unspecified hyperactivity presence  F98.8      RECOMMENDATIONS: Jackie Davis is a 44 y.o. female whose past medical history and cardiac risk factors include: ADD, anxiety, depression, hypercholesterolemia.  Palpitations:  EKG shows normal sinus rhythm without underlying ectopy, ischemia, or injury pattern.  Patient started noticing this after abruptly stopping SSRIs 2 weeks prior.  Hemoglobin within normal limits.  TSH within normal limits.  No known reversible causes; however, confounding factors may include underlying anxiety and depression.  7-day extended Holter monitor to rule out underlying dysrhythmias.  Echocardiogram will be ordered to evaluate for structural heart disease and left ventricular systolic function.  Hypercholesterolemia:  Currently managed by primary care provider.  Patient is asked to consider undergoing calcium scoring testing for further stratification.  She states that she will discuss it further with her PCP.  FINAL MEDICATION LIST  END OF ENCOUNTER: No orders of the defined types were placed in this encounter.   Medications Discontinued During This Encounter  Medication Reason  . TRINTELLIX 20 MG TABS tablet Patient Preference  . methylphenidate 18 MG PO CR tablet Change in therapy     Current Outpatient Medications:  .  methylphenidate (CONCERTA) 36 MG PO CR tablet, Take 36 mg by mouth daily. Takes 2 tablets daily, Disp: , Rfl:  .  valACYclovir (VALTREX) 500 MG tablet, Take one tablet (500 mg) by mouth daily and then one tablet (500 mg) by mouth twice a day for 3 days  as needed for an outbreak., Disp: 110 tablet, Rfl: 3 .  Vitamin D, Ergocalciferol, (DRISDOL) 1.25 MG (50000 UNIT) CAPS capsule, Take 1 capsule by mouth once a week., Disp: , Rfl:   Orders Placed This Encounter  Procedures  . LONG TERM MONITOR (3-14 DAYS)  . EKG 12-Lead  . PCV ECHOCARDIOGRAM COMPLETE    There are no Patient Instructions on file for this visit.   --Continue cardiac medications as reconciled in final medication list. --Return in about 6 weeks (around 09/15/2020) for Reevaluation of palpitations, Review test results. Or sooner if needed. --Continue follow-up with your primary care physician regarding the management of your other chronic comorbid conditions.  Patient's questions and concerns were addressed to her satisfaction. She voices understanding of the instructions provided during this encounter.   This note was created using a voice recognition software as a result there may be grammatical errors inadvertently enclosed that do not reflect the nature of this encounter. Every attempt is made to correct such errors.  Rex Kras, [place], [facility]  Pager: [phone] Office: [phone]

## 2020-08-09 ENCOUNTER — Ambulatory Visit: Payer: Managed Care, Other (non HMO)

## 2020-08-09 ENCOUNTER — Other Ambulatory Visit: Payer: Self-pay

## 2020-08-09 ENCOUNTER — Other Ambulatory Visit: Payer: Federal, State, Local not specified - PPO

## 2020-08-09 DIAGNOSIS — R000 Tachycardia, unspecified: Secondary | ICD-10-CM | POA: Diagnosis not present

## 2020-08-09 DIAGNOSIS — R002 Palpitations: Secondary | ICD-10-CM | POA: Diagnosis not present

## 2020-08-09 DIAGNOSIS — E7800 Pure hypercholesterolemia, unspecified: Secondary | ICD-10-CM

## 2020-08-21 NOTE — Progress Notes (Signed)
Called pt no answer, left a vm

## 2020-08-24 DIAGNOSIS — R002 Palpitations: Secondary | ICD-10-CM | POA: Diagnosis not present

## 2020-08-24 NOTE — Progress Notes (Signed)
Called pt, no answer, left VM request for call back. AD/S

## 2020-08-24 NOTE — Progress Notes (Signed)
Second attempt; no answer.

## 2020-08-30 DIAGNOSIS — L718 Other rosacea: Secondary | ICD-10-CM | POA: Diagnosis not present

## 2020-08-30 DIAGNOSIS — L811 Chloasma: Secondary | ICD-10-CM | POA: Diagnosis not present

## 2020-08-30 NOTE — Progress Notes (Signed)
No answer left a vm will try again later

## 2020-09-15 ENCOUNTER — Ambulatory Visit: Payer: Federal, State, Local not specified - PPO | Admitting: Cardiology

## 2020-10-10 ENCOUNTER — Ambulatory Visit: Payer: Managed Care, Other (non HMO) | Admitting: Obstetrics and Gynecology

## 2020-10-26 DIAGNOSIS — Z1159 Encounter for screening for other viral diseases: Secondary | ICD-10-CM | POA: Diagnosis not present

## 2020-10-31 ENCOUNTER — Ambulatory Visit: Payer: Self-pay | Admitting: Nurse Practitioner

## 2020-11-06 ENCOUNTER — Other Ambulatory Visit: Payer: Self-pay | Admitting: Obstetrics and Gynecology

## 2020-11-06 NOTE — Telephone Encounter (Signed)
Last AEX 09/06/19 with Dr. Edward Jolly. I asked the appointment desk to contact her to schedule appt.

## 2020-12-12 ENCOUNTER — Ambulatory Visit: Payer: Managed Care, Other (non HMO) | Admitting: Obstetrics and Gynecology

## 2021-01-04 ENCOUNTER — Other Ambulatory Visit: Payer: Self-pay

## 2021-01-04 ENCOUNTER — Encounter: Payer: Self-pay | Admitting: Adult Health

## 2021-01-04 ENCOUNTER — Ambulatory Visit: Payer: Federal, State, Local not specified - PPO | Admitting: Adult Health

## 2021-01-04 DIAGNOSIS — M5440 Lumbago with sciatica, unspecified side: Secondary | ICD-10-CM | POA: Diagnosis not present

## 2021-01-04 DIAGNOSIS — M545 Low back pain, unspecified: Secondary | ICD-10-CM | POA: Insufficient documentation

## 2021-01-04 MED ORDER — IBUPROFEN 800 MG PO TABS: 800.0000 mg | ORAL_TABLET | Freq: Three times a day (TID) | ORAL | 0 refills | Status: DC | PRN

## 2021-01-04 MED ORDER — HYDROCODONE-ACETAMINOPHEN 5-325 MG PO TABS: 1.0000 | ORAL_TABLET | Freq: Every evening | ORAL | 0 refills | Status: DC | PRN

## 2021-01-04 MED ORDER — CYCLOBENZAPRINE HCL 5 MG PO TABS: 5.0000 mg | ORAL_TABLET | Freq: Three times a day (TID) | ORAL | 0 refills | Status: DC | PRN

## 2021-01-04 NOTE — Patient Instructions (Addendum)
Ibuprofen 800mg . Three times a day  For 5 days . Take with food  Flexeril 5mg  Three times a day  As needed  Muscle spasm - will make you sleep  Vicodin 1/2 -1 At bedtime  For severe pain , will make you sleepy Alternate ice and heat to area.  Activity as tolerated.  If not improving will need further evaluation /referral to ortho.  Please contact office for sooner follow up if symptoms do not improve or worsen or seek emergency care   Sciatica  Sciatica is pain, numbness, weakness, or tingling along the path of the sciatic nerve. The sciatic nerve starts in the lower back and runs down the back of each leg. The nerve controls the muscles in the lower leg and in the back of the knee. It also provides feeling (sensation) to the back of the thigh, the lower leg, and the sole of the foot. Sciatica is a symptom of another medical condition that pinches or puts pressure on the sciatic nerve. Sciatica most often only affects one side of the body. Sciatica usually goes away on its own or with treatment. In some cases, sciatica may come back (recur). What are the causes? This condition is caused by pressure on the sciatic nerve or pinching of the nerve. This may be the result of:  A disk in between the bones of the spine bulging out too far (herniated disk).  Age-related changes in the spinal disks.  A pain disorder that affects a muscle in the buttock.  Extra bone growth near the sciatic nerve.  A break (fracture) of the pelvis.  Pregnancy.  Tumor. This is rare. What increases the risk? The following factors may make you more likely to develop this condition:  Playing sports that place pressure or stress on the spine.  Having poor strength and flexibility.  A history of back injury or surgery.  Sitting for long periods of time.  Doing activities that involve repetitive bending or lifting.  Obesity. What are the signs or symptoms? Symptoms can vary from mild to very severe, and they  may include:  Any of these problems in the lower back, leg, hip, or buttock: ? Mild tingling, numbness, or dull aches. ? Burning sensations. ? Sharp pains.  Numbness in the back of the calf or the sole of the foot.  Leg weakness.  Severe back pain that makes movement difficult. Symptoms may get worse when you cough, sneeze, or laugh, or when you sit or stand for long periods of time. How is this diagnosed? This condition may be diagnosed based on:  Your symptoms and medical history.  A physical exam.  Blood tests.  Imaging tests, such as: ? X-rays. ? MRI. ? CT scan. How is this treated? In many cases, this condition improves on its own without treatment. However, treatment may include:  Reducing or modifying physical activity.  Exercising and stretching.  Icing and applying heat to the affected area.  Medicines that help to: ? Relieve pain and swelling. ? Relax your muscles.  Injections of medicines that help to relieve pain, irritation, and inflammation around the sciatic nerve (steroids).  Surgery. Follow these instructions at home: Medicines  Take over-the-counter and prescription medicines only as told by your health care provider.  Ask your health care provider if the medicine prescribed to you: ? Requires you to avoid driving or using heavy machinery. ? Can cause constipation. You may need to take these actions to prevent or treat constipation:  Drink  enough fluid to keep your urine pale yellow.  Take over-the-counter or prescription medicines.  Eat foods that are high in fiber, such as beans, whole grains, and fresh fruits and vegetables.  Limit foods that are high in fat and processed sugars, such as fried or sweet foods. Managing pain  If directed, put ice on the affected area. ? Put ice in a plastic bag. ? Place a towel between your skin and the bag. ? Leave the ice on for 20 minutes, 2-3 times a day.  If directed, apply heat to the affected  area. Use the heat source that your health care provider recommends, such as a moist heat pack or a heating pad. ? Place a towel between your skin and the heat source. ? Leave the heat on for 20-30 minutes. ? Remove the heat if your skin turns bright red. This is especially important if you are unable to feel pain, heat, or cold. You may have a greater risk of getting burned.      Activity  Return to your normal activities as told by your health care provider. Ask your health care provider what activities are safe for you.  Avoid activities that make your symptoms worse.  Take brief periods of rest throughout the day. ? When you rest for longer periods, mix in some mild activity or stretching between periods of rest. This will help to prevent stiffness and pain. ? Avoid sitting for long periods of time without moving. Get up and move around at least one time each hour.  Exercise and stretch regularly, as told by your health care provider.  Do not lift anything that is heavier than 10 lb (4.5 kg) while you have symptoms of sciatica. When you do not have symptoms, you should still avoid heavy lifting, especially repetitive heavy lifting.  When you lift objects, always use proper lifting technique, which includes: ? Bending your knees. ? Keeping the load close to your body. ? Avoiding twisting.   General instructions  Maintain a healthy weight. Excess weight puts extra stress on your back.  Wear supportive, comfortable shoes. Avoid wearing high heels.  Avoid sleeping on a mattress that is too soft or too hard. A mattress that is firm enough to support your back when you sleep may help to reduce your pain.  Keep all follow-up visits as told by your health care provider. This is important. Contact a health care provider if:  You have pain that: ? Wakes you up when you are sleeping. ? Gets worse when you lie down. ? Is worse than you have experienced in the past. ? Lasts longer than  4 weeks.  You have an unexplained weight loss. Get help right away if:  You are not able to control when you urinate or have bowel movements (incontinence).  You have: ? Weakness in your lower back, pelvis, buttocks, or legs that gets worse. ? Redness or swelling of your back. ? A burning sensation when you urinate. Summary  Sciatica is pain, numbness, weakness, or tingling along the path of the sciatic nerve.  This condition is caused by pressure on the sciatic nerve or pinching of the nerve.  Sciatica can cause pain, numbness, or tingling in the lower back, legs, hips, and buttocks.  Treatment often includes rest, exercise, medicines, and applying ice or heat. This information is not intended to replace advice given to you by your health care provider. Make sure you discuss any questions you have with your health care  provider. Document Revised: 10/19/2018 Document Reviewed: 10/19/2018 Elsevier Patient Education  2021 [employer].

## 2021-01-04 NOTE — Assessment & Plan Note (Addendum)
Acute low back pain with probable sciatica.  Patient has  radicular pain down the right leg.  Lower extremity strength is normal bilaterally.  With normal gait. We will begin with nonsteroidals muscle relaxers supportive care with ice and heat.  Light activity. Have given her Vicodin No. 5 tablets only to be used for severe pain at nighttime.  Patient education on sedating medications and to use with extreme caution. Patient denies any pregnancy. Patient is aware of symptoms or not improving she will need further evaluation have suggested that referral to orthopedics will be necessary.  If not improving will most likely need imaging We will send notes to her primary care provider.  Patient is advised to follow-up with primary care as planned and as needed   Plan   Patient Instructions   Ibuprofen 800mg . Three times a day  For 5 days . Take with food  Flexeril 5mg  Three times a day  As needed  Muscle spasm - will make you sleep  Vicodin 1/2 -1 At bedtime  For severe pain , will make you sleepy Alternate ice and heat to area.  Activity as tolerated.  If not improving will need further evaluation /referral to ortho.  Please contact office for sooner follow up if symptoms do not improve or worsen or seek emergency care   Sciatica  Sciatica is pain, numbness, weakness, or tingling along the path of the sciatic nerve. The sciatic nerve starts in the lower back and runs down the back of each leg. The nerve controls the muscles in the lower leg and in the back of the knee. It also provides feeling (sensation) to the back of the thigh, the lower leg, and the sole of the foot. Sciatica is a symptom of another medical condition that pinches or puts pressure on the sciatic nerve. Sciatica most often only affects one side of the body. Sciatica usually goes away on its own or with treatment. In some cases, sciatica may come back (recur). What are the causes? This condition is caused by pressure on the  sciatic nerve or pinching of the nerve. This may be the result of:  A disk in between the bones of the spine bulging out too far (herniated disk).  Age-related changes in the spinal disks.  A pain disorder that affects a muscle in the buttock.  Extra bone growth near the sciatic nerve.  A break (fracture) of the pelvis.  Pregnancy.  Tumor. This is rare. What increases the risk? The following factors may make you more likely to develop this condition:  Playing sports that place pressure or stress on the spine.  Having poor strength and flexibility.  A history of back injury or surgery.  Sitting for long periods of time.  Doing activities that involve repetitive bending or lifting.  Obesity. What are the signs or symptoms? Symptoms can vary from mild to very severe, and they may include:  Any of these problems in the lower back, leg, hip, or buttock: ? Mild tingling, numbness, or dull aches. ? Burning sensations. ? Sharp pains.  Numbness in the back of the calf or the sole of the foot.  Leg weakness.  Severe back pain that makes movement difficult. Symptoms may get worse when you cough, sneeze, or laugh, or when you sit or stand for long periods of time. How is this diagnosed? This condition may be diagnosed based on:  Your symptoms and medical history.  A physical exam.  Blood tests.  Imaging tests, such  as: ? X-rays. ? MRI. ? CT scan. How is this treated? In many cases, this condition improves on its own without treatment. However, treatment may include:  Reducing or modifying physical activity.  Exercising and stretching.  Icing and applying heat to the affected area.  Medicines that help to: ? Relieve pain and swelling. ? Relax your muscles.  Injections of medicines that help to relieve pain, irritation, and inflammation around the sciatic nerve (steroids).  Surgery. Follow these instructions at home: Medicines  Take over-the-counter and  prescription medicines only as told by your health care provider.  Ask your health care provider if the medicine prescribed to you: ? Requires you to avoid driving or using heavy machinery. ? Can cause constipation. You may need to take these actions to prevent or treat constipation:  Drink enough fluid to keep your urine pale yellow.  Take over-the-counter or prescription medicines.  Eat foods that are high in fiber, such as beans, whole grains, and fresh fruits and vegetables.  Limit foods that are high in fat and processed sugars, such as fried or sweet foods. Managing pain  If directed, put ice on the affected area. ? Put ice in a plastic bag. ? Place a towel between your skin and the bag. ? Leave the ice on for 20 minutes, 2-3 times a day.  If directed, apply heat to the affected area. Use the heat source that your health care provider recommends, such as a moist heat pack or a heating pad. ? Place a towel between your skin and the heat source. ? Leave the heat on for 20-30 minutes. ? Remove the heat if your skin turns bright red. This is especially important if you are unable to feel pain, heat, or cold. You may have a greater risk of getting burned.      Activity  Return to your normal activities as told by your health care provider. Ask your health care provider what activities are safe for you.  Avoid activities that make your symptoms worse.  Take brief periods of rest throughout the day. ? When you rest for longer periods, mix in some mild activity or stretching between periods of rest. This will help to prevent stiffness and pain. ? Avoid sitting for long periods of time without moving. Get up and move around at least one time each hour.  Exercise and stretch regularly, as told by your health care provider.  Do not lift anything that is heavier than 10 lb (4.5 kg) while you have symptoms of sciatica. When you do not have symptoms, you should still avoid heavy  lifting, especially repetitive heavy lifting.  When you lift objects, always use proper lifting technique, which includes: ? Bending your knees. ? Keeping the load close to your body. ? Avoiding twisting.   General instructions  Maintain a healthy weight. Excess weight puts extra stress on your back.  Wear supportive, comfortable shoes. Avoid wearing high heels.  Avoid sleeping on a mattress that is too soft or too hard. A mattress that is firm enough to support your back when you sleep may help to reduce your pain.  Keep all follow-up visits as told by your health care provider. This is important. Contact a health care provider if:  You have pain that: ? Wakes you up when you are sleeping. ? Gets worse when you lie down. ? Is worse than you have experienced in the past. ? Lasts longer than 4 weeks.  You have an unexplained weight  loss. Get help right away if:  You are not able to control when you urinate or have bowel movements (incontinence).  You have: ? Weakness in your lower back, pelvis, buttocks, or legs that gets worse. ? Redness or swelling of your back. ? A burning sensation when you urinate. Summary  Sciatica is pain, numbness, weakness, or tingling along the path of the sciatic nerve.  This condition is caused by pressure on the sciatic nerve or pinching of the nerve.  Sciatica can cause pain, numbness, or tingling in the lower back, legs, hips, and buttocks.  Treatment often includes rest, exercise, medicines, and applying ice or heat. This information is not intended to replace advice given to you by your health care provider. Make sure you discuss any questions you have with your health care provider. Document Revised: 10/19/2018 Document Reviewed: 10/19/2018 Elsevier Patient Education  2021 [employer].

## 2021-01-04 NOTE — Progress Notes (Signed)
  ID: Jackie Davis, female    DOB: 1975-12-08, 45 y.o.   MRN: [id number]  Chief Complaint  Patient presents with  . Follow-up    Referring provider: Pearson Grippe, MD  HPI: 45 yo female never smoker with no medical problem mild attention deficit disorder  TEST/EVENTS :   01/04/2021 Acute OV : Back pain  Patient presents for an acute office visit.  Patient says she has been in her normal good state of health.  Has attention deficit disorder otherwise has unremarkable medical history.  Try to get in with her primary care provider but could not get in.  Presents today to our office as she has been seen here in the past.  Complains that about 5 to 6 days ago she was given her dog a bath.  Was doing some lifting taken in and out of the tub and then felt some pulling in her lower back.  Pain continued to worsen and has been present for the last 5 to 6 days.  She complains of moderate to severe pain at times.  She is able to do her normal activities but pain has never went away.  She did take naproxen for the initial 2 to 3 days.  But has not taken the last couple days.  She says it did help some.  She feels that she has spasms that time if she moves she feels a catching patient also has radiation down her right leg.  She denies any urinary symptoms of hematuria loss of bladder or bowel.  No nausea vomiting diarrhea no fever.  Says that she has had some low back issues in the past but never that lasted this long.  Movement bending makes symptoms worse.  She also uses over-the-counter supplements that are supposed to help with muscle spasm but did not work very well.  She says that she is not been able to sleep for the last several nights.   No Known Allergies  Immunization History  Administered Date(s) Administered  . H1N1 09/14/2008  . HPV 9-valent 09/06/2019  . Influenza Whole 08/15/2008  . Influenza,inj,Quad PF,6+ Mos 09/12/2020  . PFIZER(Purple Top)SARS-COV-2 Vaccination  01/06/2020, 01/27/2020, 09/12/2020    Past Medical History:  Diagnosis Date  . ADD (attention deficit disorder)   . Anxiety   . Hypercholesterolemia   . Positive PPD   . STD (sexually transmitted disease)    Hx HSV II  . Vitamin D deficiency     Tobacco History: Social History   Tobacco Use  Smoking Status Never Smoker  Smokeless Tobacco Never Used   Counseling given: Not Answered   Outpatient Medications Prior to Visit  Medication Sig Dispense Refill  . amphetamine-dextroamphetamine (ADDERALL XR) 20 MG 24 hr capsule Take 20 mg by mouth 2 (two) times daily.    . methylphenidate 36 MG PO CR tablet Take 36 mg by mouth daily. Takes 2 tablets daily (Patient not taking: Reported on 01/04/2021)    . valACYclovir (VALTREX) 500 MG tablet TAKE 1 TABLET BY MOUTH EVERY DAY AND 1 TABLET BY MOUTH TWICE DAILY FOR 3 DAYS AS NEEDED FOR AN OUTBREAK (Patient not taking: Reported on 01/04/2021) 30 tablet 0  . Vitamin D, Ergocalciferol, (DRISDOL) 1.25 MG (50000 UNIT) CAPS capsule Take 1 capsule by mouth once a week. (Patient not taking: Reported on 01/04/2021)     No facility-administered medications prior to visit.     Review of Systems:   Constitutional:   No  weight loss, night  sweats,  Fevers, chills, fatigue, or  lassitude.  HEENT:   No headaches,  Difficulty swallowing,  Tooth/dental problems, or  Sore throat,                No sneezing, itching, ear ache, nasal congestion, post nasal drip,   CV:  No chest pain,  Orthopnea, PND, swelling in lower extremities, anasarca, dizziness, palpitations, syncope.   GI  No heartburn, indigestion, abdominal pain, nausea, vomiting, diarrhea, change in bowel habits, loss of appetite, bloody stools.   Resp: No shortness of breath with exertion or at rest.  No excess mucus, no productive cough,  No non-productive cough,  No coughing up of blood.  No change in color of mucus.  No wheezing.  No chest wall deformity  Skin: no rash or lesions.  GU: no  dysuria, change in color of urine, no urgency or frequency.  No flank pain, no hematuria   MS:  No joint pain or swelling.  No decreased range of motion.  + back pain.    Physical Exam  BP 100/70 (BP Location: Left Arm, Patient Position: Sitting, Cuff Size: Normal)   Pulse (!) 101   Temp 97.9 F (36.6 C) (Temporal)   Ht 5\' 9"  (1.753 m)   Wt 201 lb 3.2 oz (91.3 kg)   SpO2 100%   BMI 29.71 kg/m   GEN: A/Ox3; pleasant , NAD, well nourished    HEENT:  [place]/AT,  NOSE-clear, THROAT-clear, no lesions, no postnasal drip or exudate noted.   NECK:  Supple w/ fair ROM; no JVD; normal carotid impulses w/o bruits; no thyromegaly or nodules palpated; no lymphadenopathy.    RESP  Clear  P & A; w/o, wheezes/ rales/ or rhonchi. no accessory muscle use, no dullness to percussion  CARD:  RRR, no m/r/g, no peripheral edema, pulses intact, no cyanosis or clubbing.  GI:   Soft & nt; nml bowel sounds; no organomegaly or masses detected.   Musco: Warm bil, no deformities or joint swelling noted.  Tenderness along bilateral lower back no deformity noted.  No redness or swelling no rash noted.  Equal strength of lower extremities.  Normal gait.  Neuro: alert, no focal deficits noted.    Skin: Warm, no lesions or rashes    Lab Results:  CBC No results found for: WBC, RBC, HGB, HCT, PLT, MCV, MCH, MCHC, RDW, LYMPHSABS, MONOABS, EOSABS, BASOSABS  BMET No results found for: NA, K, CL, CO2, GLUCOSE, BUN, CREATININE, CALCIUM, GFRNONAA, GFRAA  BNP No results found for: BNP  ProBNP No results found for: PROBNP  Imaging: No results found.    No flowsheet data found.  No results found for: NITRICOXIDE      Assessment & Plan:   Acute low back pain Acute low back pain with probable sciatica.  Patient has  radicular pain down the right leg.  Lower extremity strength is normal bilaterally.  With normal gait. We will begin with nonsteroidals muscle relaxers supportive care with ice and  heat.  Light activity. Have given her Vicodin No. 5 tablets only to be used for severe pain at nighttime.  Patient education on sedating medications and to use with extreme caution. Patient denies any pregnancy. Patient is aware of symptoms or not improving she will need further evaluation have suggested that referral to orthopedics will be necessary.  If not improving will most likely need imaging We will send notes to her primary care provider.  Patient is advised to follow-up with primary care as  planned and as needed   Plan   Patient Instructions   Ibuprofen . Three times a day  For 5 days . Take with food  Flexeril  Three times a day  As needed  Muscle spasm - will make you sleep  Vicodin 1/2 -1 At bedtime  For severe pain , will make you sleepy Alternate ice and heat to area.  Activity as tolerated.  If not improving will need further evaluation /referral to ortho.  Please contact office for sooner follow up if symptoms do not improve or worsen or seek emergency care   Sciatica  Sciatica is pain, numbness, weakness, or tingling along the path of the sciatic nerve. The sciatic nerve starts in the lower back and runs down the back of each leg. The nerve controls the muscles in the lower leg and in the back of the knee. It also provides feeling (sensation) to the back of the thigh, the lower leg, and the sole of the foot. Sciatica is a symptom of another medical condition that pinches or puts pressure on the sciatic nerve. Sciatica most often only affects one side of the body. Sciatica usually goes away on its own or with treatment. In some cases, sciatica may come back (recur). What are the causes? This condition is caused by pressure on the sciatic nerve or pinching of the nerve. This may be the result of:  A disk in between the bones of the spine bulging out too far (herniated disk).  Age-related changes in the spinal disks.  A pain disorder that affects a muscle in the  buttock.  Extra bone growth near the sciatic nerve.  A break (fracture) of the pelvis.  Pregnancy.  Tumor. This is rare. What increases the risk? The following factors may make you more likely to develop this condition:  Playing sports that place pressure or stress on the spine.  Having poor strength and flexibility.  A history of back injury or surgery.  Sitting for long periods of time.  Doing activities that involve repetitive bending or lifting.  Obesity. What are the signs or symptoms? Symptoms can vary from mild to very severe, and they may include:  Any of these problems in the lower back, leg, hip, or buttock: ? Mild tingling, numbness, or dull aches. ? Burning sensations. ? Sharp pains.  Numbness in the back of the calf or the sole of the foot.  Leg weakness.  Severe back pain that makes movement difficult. Symptoms may get worse when you cough, sneeze, or laugh, or when you sit or stand for long periods of time. How is this diagnosed? This condition may be diagnosed based on:  Your symptoms and medical history.  A physical exam.  Blood tests.  Imaging tests, such as: ? X-rays. ? MRI. ? CT scan. How is this treated? In many cases, this condition improves on its own without treatment. However, treatment may include:  Reducing or modifying physical activity.  Exercising and stretching.  Icing and applying heat to the affected area.  Medicines that help to: ? Relieve pain and swelling. ? Relax your muscles.  Injections of medicines that help to relieve pain, irritation, and inflammation around the sciatic nerve (steroids).  Surgery. Follow these instructions at home: Medicines  Take over-the-counter and prescription medicines only as told by your health care provider.  Ask your health care provider if the medicine prescribed to you: ? Requires you to avoid driving or using heavy machinery. ? Can cause constipation. You  may need to take  these actions to prevent or treat constipation:  Drink enough fluid to keep your urine pale yellow.  Take over-the-counter or prescription medicines.  Eat foods that are high in fiber, such as beans, whole grains, and fresh fruits and vegetables.  Limit foods that are high in fat and processed sugars, such as fried or sweet foods. Managing pain  If directed, put ice on the affected area. ? Put ice in a plastic bag. ? Place a towel between your skin and the bag. ? Leave the ice on for 20 minutes, 2-3 times a day.  If directed, apply heat to the affected area. Use the heat source that your health care provider recommends, such as a moist heat pack or a heating pad. ? Place a towel between your skin and the heat source. ? Leave the heat on for 20-30 minutes. ? Remove the heat if your skin turns bright red. This is especially important if you are unable to feel pain, heat, or cold. You may have a greater risk of getting burned.      Activity  Return to your normal activities as told by your health care provider. Ask your health care provider what activities are safe for you.  Avoid activities that make your symptoms worse.  Take brief periods of rest throughout the day. ? When you rest for longer periods, mix in some mild activity or stretching between periods of rest. This will help to prevent stiffness and pain. ? Avoid sitting for long periods of time without moving. Get up and move around at least one time each hour.  Exercise and stretch regularly, as told by your health care provider.  Do not lift anything that is heavier than 10 lb (4.5 kg) while you have symptoms of sciatica. When you do not have symptoms, you should still avoid heavy lifting, especially repetitive heavy lifting.  When you lift objects, always use proper lifting technique, which includes: ? Bending your knees. ? Keeping the load close to your body. ? Avoiding twisting.   General instructions  Maintain a  healthy weight. Excess weight puts extra stress on your back.  Wear supportive, comfortable shoes. Avoid wearing high heels.  Avoid sleeping on a mattress that is too soft or too hard. A mattress that is firm enough to support your back when you sleep may help to reduce your pain.  Keep all follow-up visits as told by your health care provider. This is important. Contact a health care provider if:  You have pain that: ? Wakes you up when you are sleeping. ? Gets worse when you lie down. ? Is worse than you have experienced in the past. ? Lasts longer than 4 weeks.  You have an unexplained weight loss. Get help right away if:  You are not able to control when you urinate or have bowel movements (incontinence).  You have: ? Weakness in your lower back, pelvis, buttocks, or legs that gets worse. ? Redness or swelling of your back. ? A burning sensation when you urinate. Summary  Sciatica is pain, numbness, weakness, or tingling along the path of the sciatic nerve.  This condition is caused by pressure on the sciatic nerve or pinching of the nerve.  Sciatica can cause pain, numbness, or tingling in the lower back, legs, hips, and buttocks.  Treatment often includes rest, exercise, medicines, and applying ice or heat. This information is not intended to replace advice given to you by your health care  provider. Make sure you discuss any questions you have with your health care provider. Document Revised: 10/19/2018 Document Reviewed: 10/19/2018 Elsevier Patient Education  2021 [employer].         [place], [place] 01/04/2021

## 2021-01-06 ENCOUNTER — Other Ambulatory Visit: Payer: Self-pay | Admitting: Adult Health

## 2021-01-08 ENCOUNTER — Ambulatory Visit: Payer: Managed Care, Other (non HMO) | Admitting: Adult Health

## 2021-01-08 ENCOUNTER — Other Ambulatory Visit: Payer: Self-pay | Admitting: Adult Health

## 2021-08-01 NOTE — Progress Notes (Deleted)
45 y.o. G32P1001 Married [place] female here for annual exam.    PCP:     No LMP recorded. (Menstrual status: IUD).           Sexually active: {yes no:314532}  The current method of family planning is IUD.   Mirena inserted 04-19-16 Exercising: {yes no:314532}  {types:19826} Smoker:  {YES NO:22349}  Health Maintenance: Pap:  09-06-19 neg HPV HR neg History of abnormal Pap:  no MMG:  none Colonoscopy:  none BMD:   n/a  Result  n/a TDaP:  2014 Gardasil:   had 1 HIV: neg 2018 Hep C: neg 2018 Screening Labs:  Hb today: ***, Urine today: ***   reports that she has never smoked. She has never used smokeless tobacco. She reports current alcohol use. She reports that she does not use drugs.  Past Medical History:  Diagnosis Date   ADD (attention deficit disorder)    Anxiety    Hypercholesterolemia    Positive PPD    STD (sexually transmitted disease)    Hx HSV II   Vitamin D deficiency     Past Surgical History:  Procedure Laterality Date   labial reduction     vulvar surgery   LIPOSUCTION      Current Outpatient Medications  Medication Sig Dispense Refill   amphetamine-dextroamphetamine (ADDERALL XR) 20 MG 24 hr capsule Take 20 mg by mouth 2 (two) times daily.     cyclobenzaprine (FLEXERIL) 5 MG tablet Take 1 tablet (5 mg total) by mouth 3 (three) times daily as needed for muscle spasms. 21 tablet 0   HYDROcodone-acetaminophen (NORCO/VICODIN) 5-325 MG tablet Take 1 tablet by mouth at bedtime as needed for moderate pain. 5 tablet 0   ibuprofen (ADVIL) 800 MG tablet Take 1 tablet (800 mg total) by mouth every 8 (eight) hours as needed. 15 tablet 0   No current facility-administered medications for this visit.    Family History  Problem Relation Age of Onset   Diabetes Father    Heart disease Father    Healthy Mother    Hyperlipidemia Mother    Cancer Maternal Uncle 46       Dec Glyoblastoma age 66   Hyperlipidemia Maternal Grandmother    Healthy Other         Sibling   Hypertension Other     Review of Systems  Exam:   There were no vitals taken for this visit.    General appearance: alert, cooperative and appears stated age Head: normocephalic, without obvious abnormality, atraumatic Neck: no adenopathy, supple, symmetrical, trachea midline and thyroid normal to inspection and palpation Lungs: clear to auscultation bilaterally Breasts: normal appearance, no masses or tenderness, No nipple retraction or dimpling, No nipple discharge or bleeding, No axillary adenopathy Heart: regular rate and rhythm Abdomen: soft, non-tender; no masses, no organomegaly Extremities: extremities normal, atraumatic, no cyanosis or edema Skin: skin color, texture, turgor normal. No rashes or lesions Lymph nodes: cervical, supraclavicular, and axillary nodes normal. Neurologic: grossly normal  Pelvic: External genitalia:  no lesions              No abnormal inguinal nodes palpated.              Urethra:  normal appearing urethra with no masses, tenderness or lesions              Bartholins and Skenes: normal                 Vagina: normal appearing vagina with  normal color and discharge, no lesions              Cervix: no lesions              Pap taken: {yes no:314532} Bimanual Exam:  Uterus:  normal size, contour, position, consistency, mobility, non-tender              Adnexa: no mass, fullness, tenderness              Rectal exam: {yes no:314532}.  Confirms.              Anus:  normal sphincter tone, no lesions  Chaperone was present for exam:  ***  Assessment:   Well woman visit with gynecologic exam.   Plan: Mammogram screening discussed. Self breast awareness reviewed. Pap and HR HPV as above. Guidelines for Calcium, Vitamin D, regular exercise program including cardiovascular and weight bearing exercise.   Follow up annually and prn.   Additional counseling given.  {yes [id number]. _______ minutes face to face time of which over 50% was  spent in counseling.    After visit summary provided.

## 2021-08-03 ENCOUNTER — Ambulatory Visit: Payer: Federal, State, Local not specified - PPO | Admitting: Obstetrics and Gynecology

## 2021-08-06 DIAGNOSIS — M545 Low back pain, unspecified: Secondary | ICD-10-CM | POA: Diagnosis not present

## 2021-08-06 DIAGNOSIS — E663 Overweight: Secondary | ICD-10-CM | POA: Diagnosis not present

## 2021-08-06 DIAGNOSIS — E559 Vitamin D deficiency, unspecified: Secondary | ICD-10-CM | POA: Diagnosis not present

## 2021-08-06 DIAGNOSIS — R030 Elevated blood-pressure reading, without diagnosis of hypertension: Secondary | ICD-10-CM | POA: Diagnosis not present

## 2021-08-06 DIAGNOSIS — Z23 Encounter for immunization: Secondary | ICD-10-CM | POA: Diagnosis not present

## 2021-08-07 ENCOUNTER — Other Ambulatory Visit: Payer: Self-pay | Admitting: Internal Medicine

## 2021-08-14 DIAGNOSIS — Z131 Encounter for screening for diabetes mellitus: Secondary | ICD-10-CM | POA: Diagnosis not present

## 2021-08-14 DIAGNOSIS — E663 Overweight: Secondary | ICD-10-CM | POA: Diagnosis not present

## 2021-08-14 DIAGNOSIS — E559 Vitamin D deficiency, unspecified: Secondary | ICD-10-CM | POA: Diagnosis not present

## 2021-08-14 DIAGNOSIS — Z136 Encounter for screening for cardiovascular disorders: Secondary | ICD-10-CM | POA: Diagnosis not present

## 2021-08-21 ENCOUNTER — Ambulatory Visit
Admission: RE | Admit: 2021-08-21 | Discharge: 2021-08-21 | Disposition: A | Discharge status: Home / Self Care | Payer: Federal, State, Local not specified - PPO | Source: Ambulatory Visit | Attending: Internal Medicine | Admitting: Internal Medicine

## 2021-08-21 ENCOUNTER — Other Ambulatory Visit: Payer: Self-pay

## 2021-08-21 ENCOUNTER — Other Ambulatory Visit: Payer: Self-pay | Admitting: Internal Medicine

## 2021-08-21 DIAGNOSIS — M545 Low back pain, unspecified: Secondary | ICD-10-CM | POA: Diagnosis not present

## 2021-08-22 ENCOUNTER — Other Ambulatory Visit: Payer: Self-pay

## 2021-08-22 ENCOUNTER — Telehealth: Payer: Self-pay | Admitting: Adult Health

## 2021-08-22 MED ORDER — IBUPROFEN 800 MG PO TABS
800.0000 mg | ORAL_TABLET | Freq: Three times a day (TID) | ORAL | 0 refills | Status: DC | PRN
Start: 2021-08-22 — End: 2022-09-25

## 2021-08-22 NOTE — Telephone Encounter (Signed)
Patient called in would like refill of Ibuprofen 800mg  . Seen 12/2020 for back pain  Can have 1 refill  Needs to see PCP if not improving or refer to Ortho  Do not take advil if preg.   Rx sent  Please call patient to notify

## 2021-08-22 NOTE — Telephone Encounter (Signed)
LMTCB

## 2021-09-18 DIAGNOSIS — Z23 Encounter for immunization: Secondary | ICD-10-CM | POA: Diagnosis not present

## 2021-09-18 DIAGNOSIS — R8290 Unspecified abnormal findings in urine: Secondary | ICD-10-CM | POA: Diagnosis not present

## 2021-09-18 DIAGNOSIS — Z0000 Encounter for general adult medical examination without abnormal findings: Secondary | ICD-10-CM | POA: Diagnosis not present

## 2021-09-18 DIAGNOSIS — Z01419 Encounter for gynecological examination (general) (routine) without abnormal findings: Secondary | ICD-10-CM | POA: Diagnosis not present

## 2021-09-18 DIAGNOSIS — F909 Attention-deficit hyperactivity disorder, unspecified type: Secondary | ICD-10-CM | POA: Diagnosis not present

## 2021-09-22 ENCOUNTER — Other Ambulatory Visit: Payer: Self-pay | Admitting: Internal Medicine

## 2021-09-22 DIAGNOSIS — Z1231 Encounter for screening mammogram for malignant neoplasm of breast: Secondary | ICD-10-CM

## 2021-10-18 DIAGNOSIS — M7712 Lateral epicondylitis, left elbow: Secondary | ICD-10-CM | POA: Diagnosis not present

## 2021-10-18 DIAGNOSIS — M4056 Lordosis, unspecified, lumbar region: Secondary | ICD-10-CM | POA: Diagnosis not present

## 2021-11-13 ENCOUNTER — Other Ambulatory Visit: Payer: Self-pay

## 2021-11-13 ENCOUNTER — Ambulatory Visit
Admission: RE | Admit: 2021-11-13 | Discharge: 2021-11-13 | Disposition: A | Discharge status: Home / Self Care | Payer: Federal, State, Local not specified - PPO | Source: Ambulatory Visit | Attending: Internal Medicine | Admitting: Internal Medicine

## 2021-11-13 DIAGNOSIS — Z1231 Encounter for screening mammogram for malignant neoplasm of breast: Secondary | ICD-10-CM | POA: Diagnosis not present

## 2021-11-26 NOTE — Therapy (Signed)
OUTPATIENT PHYSICAL THERAPY EVALUATION   Patient Name: Jackie Davis MRN: [id number] DOB:Aug 24, 1976, 46 y.o., female Today's Date: 11/27/2021   PT End of Session - 11/27/21 0843     Visit Number 1    Number of Visits 12    Date for PT Re-Evaluation 01/08/22    Authorization Type BCBS    PT Start Time 0831    PT Stop Time 0915    PT Time Calculation (min) 44 min    Activity Tolerance Patient tolerated treatment well    Behavior During Therapy WFL for tasks assessed/performed             Past Medical History:  Diagnosis Date   ADD (attention deficit disorder)    Anxiety    Hypercholesterolemia    Positive PPD    STD (sexually transmitted disease)    Hx HSV II   Vitamin D deficiency    Past Surgical History:  Procedure Laterality Date   labial reduction     vulvar surgery   LIPOSUCTION     Patient Active Problem List   Diagnosis Date Noted   Acute low back pain 01/04/2021   PCOS (polycystic ovarian syndrome) 06/23/2014   Right knee pain 02/10/2014   Weight gain 11/29/2013   POSITIVE PPD 12/01/2009   CRUSHING INJURY, FINGER 12/01/2009    PCP: Pearson Grippe, MD  REFERRING PROVIDER: Harvest Forest, MD  REFERRING DIAG: Chronic low back pain  THERAPY DIAG:  Chronic low back pain, unspecified back pain laterality, unspecified whether sciatica present  Muscle weakness (generalized)  ONSET DATE: "going on for years"   SUBJECTIVE:             SUBJECTIVE STATEMENT: Patient reports she has been having back issue that she believes is sciatica. Is mainly just affecting right leg and has been going on for years, but seems to be getting worse recently and she has a severe flare up every two weeks or some. Pain seems to be exacerbated with any lifting or increase in activity. She states that when she hurts really bad she can't move because it feels like a spasm and no position is comfortable. She has trouble standing up straight when pain is really bad.    PERTINENT HISTORY:  None  PAIN:  Are you having pain? Yes NPRS scale: 5/10 Pain location: Back Pain orientation: Lower middle, radiates to anterior right thigh PAIN TYPE: Chronic Pain description: Constant, sharp Aggravating factors: Lifting or increase in activity Relieving factors: Medication, walking  PRECAUTIONS: None  WEIGHT BEARING RESTRICTIONS No  FALLS:  Has patient fallen in last 6 months? 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 Alignment is normal. Intervertebral disc spaces are maintained.  PATIENT SURVEYS:  FOTO 47% functional status  SCREENING FOR RED FLAGS: Negative  COGNITION: Overall cognitive status: Within functional limits for tasks assessed     SENSATION:  Light touch: Appears intact  MUSCLE LENGTH: Grossly WFL  POSTURE:  Slight increase in lumbar lordosis  PALPATION: Tenderness to right lumbar paraspinals with increased muscular tension  LUMBAR AROM  AROM A/PROM  11/27/2021  Flexion 75%  Extension 50%  Right lateral flexion WFL  Left lateral  flexion 75%*  Right rotation [facility]  Left Rotation [facility]  Patient reports more discomfort with extension compared to flexion, no directional preference noted  LE AROM/PROM:   LE PROM grossly WFL  LE MMT:  MMT Right 11/27/2021 Left 11/27/2021  Hip flexion 4 4  Hip extension 3- 3-  Hip abduction 4- 4-  Knee flexion 5 5  Knee extension 5 5  Gross core strength 4-   LUMBAR SPECIAL TESTS:  Radicular testing negative to reproduce right thigh pain  FUNCTIONAL TESTS:  Patient exhibits poor lumbopelvic control when demonstrating lifting technique  GAIT: Assistive  device utilized: None Level of assistance: Complete Independence Comments: WFL   TODAY'S TREATMENT  Hooklying pelvic tilt 5 x 5 sec hold - patient cued for breathing SLR with posterior pelvic tilt x 5 each Bridge articulating x 10 Side clamshell with green x 15 each  PATIENT EDUCATION:  Education details: Exam findings, POC, HEP Person educated: Patient Education method: Explanation, Demonstration, Tactile cues, Verbal cues, and Handouts Education comprehension: verbalized understanding, returned demonstration, verbal cues required, tactile cues required, and needs further education  HOME EXERCISE PROGRAM: Access Code: [email or web address]   ASSESSMENT: CLINICAL IMPRESSION: Patient is a 46 y.o. female who was seen today for physical therapy evaluation and treatment for chronic low back and right radicular pain. Her right thigh pain was not reproduced this visit and she did not exhibit a directional preference of movement, but based on description it does seem to be radicular in nature. Her main impairment was gross strength deficit of the core and hips, and increased muscular tension/tenderness of right lumbar paraspinals. Objective impairments include decreased activity tolerance, decreased ROM, decreased strength, improper body mechanics, and pain. These impairments are limiting patient from cleaning, community activity, meal prep, occupation, laundry, yard work, and shopping. Personal factors including Past/current experiences and Time since onset of injury/illness/exacerbation are also affecting patient's functional outcome. Patient will benefit from skilled PT to address above impairments and improve overall function.  REHAB POTENTIAL: Good  CLINICAL DECISION MAKING: Stable/uncomplicated  EVALUATION COMPLEXITY: Low   GOALS: Goals reviewed with patient? Yes  SHORT TERM GOALS:  STG Name Target Date Goal status  1 Patient will be I with initial HEP in order to progress with  therapy. Baseline:  12/18/2021 INITIAL  2 PT will review FOTO with patient by 3rd visit in order to understand expected progress and outcome with therapy. Baseline:  12/18/2021 INITIAL   LONG TERM GOALS:   LTG Name Target Date Goal status  1 Patient will be I with final HEP to maintain progress from PT. Baseline: 01/08/2022 INITIAL  2 Patient will report >/= 60% status on FOTO to indicate improved functional ability. Baseline: 01/08/2022 INITIAL  3 Patient will demonstrate gross core and hip strength >/= 4/5 MMT in order to improve tolerance for lifting and household activities Baseline: patient demonstrates strength deficits of core and hip musculature 01/08/2022 INITIAL  4 Patient will report low back and right thigh pain </= 2/10 with all activity to reduce functional limitations  Baseline: patient reports 5/10 pain at rest on evaluation 01/08/2022 INITIAL    PLAN: PT FREQUENCY: 2x/week  PT DURATION: 6 weeks  PLANNED INTERVENTIONS: Therapeutic exercises, Therapeutic activity, Neuro Muscular re-education, Balance training, Gait training, Patient/Family education, Joint mobilization, Aquatic Therapy, Dry Needling, Electrical stimulation, Spinal mobilization, Cryotherapy, Moist heat, and Manual therapy  PLAN FOR NEXT SESSION: Review HEP and progress PRN, manual/dry needling for right lumbar paraspinals, continue progression of core stabilization and hip strengthening, initiate  lifting mechanics training   Rosana Hoes, PT, DPT, LAT, ATC 11/27/21  12:06 PM Phone: [phone] Fax: [phone]

## 2021-11-27 ENCOUNTER — Encounter: Payer: Self-pay | Admitting: Physical Therapy

## 2021-11-27 ENCOUNTER — Ambulatory Visit: Payer: Federal, State, Local not specified - PPO | Attending: Internal Medicine | Admitting: Physical Therapy

## 2021-11-27 ENCOUNTER — Other Ambulatory Visit: Payer: Self-pay

## 2021-11-27 DIAGNOSIS — M6281 Muscle weakness (generalized): Secondary | ICD-10-CM | POA: Diagnosis not present

## 2021-11-27 DIAGNOSIS — G8929 Other chronic pain: Secondary | ICD-10-CM | POA: Insufficient documentation

## 2021-11-27 DIAGNOSIS — M545 Low back pain, unspecified: Secondary | ICD-10-CM | POA: Diagnosis not present

## 2021-11-27 NOTE — Patient Instructions (Signed)
Access Code: [email or web address] URL: [email or web address] Date: 11/27/2021 Prepared by: Rosana Hoes  Exercises Supine Pelvic Tilt - 1 x daily - 7 x weekly - 2 sets - 10 reps - 3 seconds hold Supine Pelvic Tilt with Straight Leg Raise - 1 x daily - 7 x weekly - 2 sets - 10 reps Supine Bridge with Spinal Articulation - 1 x daily - 7 x weekly - 2 sets - 10 reps - 3 seconds hold Clam with Resistance - 1 x daily - 7 x weekly - 2 sets - 15 reps

## 2021-12-05 ENCOUNTER — Other Ambulatory Visit: Payer: Self-pay

## 2021-12-05 ENCOUNTER — Encounter: Payer: Self-pay | Admitting: Physical Therapy

## 2021-12-05 ENCOUNTER — Ambulatory Visit: Payer: Federal, State, Local not specified - PPO | Admitting: Physical Therapy

## 2021-12-05 DIAGNOSIS — M6281 Muscle weakness (generalized): Secondary | ICD-10-CM | POA: Diagnosis not present

## 2021-12-05 DIAGNOSIS — G8929 Other chronic pain: Secondary | ICD-10-CM | POA: Diagnosis not present

## 2021-12-05 DIAGNOSIS — M545 Low back pain, unspecified: Secondary | ICD-10-CM | POA: Diagnosis not present

## 2021-12-05 NOTE — Therapy (Signed)
OUTPATIENT PHYSICAL THERAPY TREATMENT NOTE   Patient Name: Jackie Davis MRN: [id number] DOB:January 02, 1976, 46 y.o., female Today's Date: 12/05/2021  PCP: Jani Gravel, MD REFERRING PROVIDER: Audley Hose, MD   PT End of Session - 12/05/21 [phone]     Visit Number 2    Number of Visits 12    Date for PT Re-Evaluation 01/08/22    Authorization Type BCBS    PT Start Time 0850    PT Stop Time 0928    PT Time Calculation (min) 38 min             Past Medical History:  Diagnosis Date   ADD (attention deficit disorder)    Anxiety    Hypercholesterolemia    Positive PPD    STD (sexually transmitted disease)    Hx HSV II   Vitamin D deficiency    Past Surgical History:  Procedure Laterality Date   labial reduction     vulvar surgery   LIPOSUCTION     Patient Active Problem List   Diagnosis Date Noted   Acute low back pain 01/04/2021   PCOS (polycystic ovarian syndrome) 06/23/2014   Right knee pain 02/10/2014   Weight gain 11/29/2013   POSITIVE PPD 12/01/2009   CRUSHING INJURY, FINGER 12/01/2009  PCP: Jani Gravel, MD   REFERRING PROVIDER: Audley Hose, MD   REFERRING DIAG: Chronic low back pain  THERAPY DIAG:  Chronic low back pain, unspecified back pain laterality, unspecified whether sciatica present  Muscle weakness (generalized)  PERTINENT HISTORY: None  PRECAUTIONS: None  SUBJECTIVE: I was having more pain last night and today. Right low back and right thigh. Pain is 5/10.  PAIN:  Are you having pain? Yes NPRS scale: 5/10 Pain location: Back Pain orientation: Lower middle, radiates to anterior right thigh PAIN TYPE: Chronic Pain description: Constant, sharp Aggravating factors: Lifting or increase in activity Relieving factors: Medication, walking     OBJECTIVE:  DIAGNOSTIC FINDINGS:  X-ray Lumbar 08/21/2021: There is no evidence of lumbar spine fracture. Alignment is normal. Intervertebral disc spaces are maintained.   PATIENT  SURVEYS:  FOTO 47% functional status   SCREENING FOR RED FLAGS: Negative   COGNITION: Overall cognitive status: Within functional limits for tasks assessed                        SENSATION:          Light touch: Appears intact   MUSCLE LENGTH: Grossly WFL   POSTURE:  Slight increase in lumbar lordosis   PALPATION: Tenderness to right lumbar paraspinals with increased muscular tension   LUMBAR AROM   AROM A/PROM  11/27/2021  Flexion 75%  Extension 50%  Right lateral flexion WFL  Left lateral flexion 75%*  Right rotation [facility]  Left Rotation [facility]  Patient reports more discomfort with extension compared to flexion, no directional preference noted   LE AROM/PROM:                     LE PROM grossly WFL   LE MMT:   MMT Right 11/27/2021 Left 11/27/2021  Hip flexion 4 4  Hip extension 3- 3-  Hip abduction 4- 4-  Knee flexion 5 5  Knee extension 5 5  Gross core strength 4-    LUMBAR SPECIAL TESTS:  Radicular testing negative to reproduce right thigh pain   FUNCTIONAL TESTS:  Patient exhibits poor lumbopelvic control when demonstrating lifting technique   GAIT: Assistive  device utilized: None Level of assistance: Complete Independence Comments: WFL     TODAY'S TREATMENT   [facility] Adult PT Treatment:                                                DATE: 12/05/21 Therapeutic Exercise: PPT x 10 PPT with SLR 10 x 2 PPT with blue band clam 10 x 2  PPT with alternating blue band clam 10 x 2- cue for technique  PPT with bridge (articulating) blue band hold at knees 10 x 2 S/L blue band clam 10 x 2, bilat  cues for eccentric control  Checked LL 90 cm left, 91 cm right Manual Therapy: Long axis distraction L and R 10 sec x 3 each - noted LLD  Self Care: Heel lift added to left shoe- two layers-  Initial Treatment: Hooklying pelvic tilt 5 x 5 sec hold - patient cued for breathing SLR with posterior pelvic tilt x 5 each Bridge articulating x 10 Side clamshell with  green x 15 each   PATIENT EDUCATION:  Education details: Exam findings, POC, HEP Person educated: Patient Education method: Explanation, Demonstration, Tactile cues, Verbal cues, and Handouts Education comprehension: verbalized understanding, returned demonstration, verbal cues required, tactile cues required, and needs further education   HOME EXERCISE PROGRAM: Access Code: [email or web address]     ASSESSMENT: CLINICAL IMPRESSION: Patient reports more pain is noted when she carries heavy items. She reports increased pain starting last night and cannot recall if she lifted something to aggravate her pain. Reviewed HEP and progressed with core and hip stabilization. Began Long axis hip distraction and pt reported feeling right sided low back pain with left LAD. Leg length discrepancy noted to be 1 cm difference , therefore heel lift was added to left shoe for patient to try. Will assess response next visit.    REHAB POTENTIAL: Good   CLINICAL DECISION MAKING: Stable/uncomplicated   EVALUATION COMPLEXITY: Low     GOALS: Goals reviewed with patient? Yes   SHORT TERM GOALS:   STG Name Target Date Goal status  1 Patient will be I with initial HEP in order to progress with therapy. Baseline:  12/18/2021 INITIAL  2 PT will review FOTO with patient by 3rd visit in order to understand expected progress and outcome with therapy. Baseline:  12/18/2021 INITIAL    LONG TERM GOALS:    LTG Name Target Date Goal status  1 Patient will be I with final HEP to maintain progress from PT. Baseline: 01/08/2022 INITIAL  2 Patient will report >/= 60% status on FOTO to indicate improved functional ability. Baseline: 01/08/2022 INITIAL  3 Patient will demonstrate gross core and hip strength >/= 4/5 MMT in order to improve tolerance for lifting and household activities Baseline: patient demonstrates strength deficits of core and hip musculature 01/08/2022 INITIAL  4 Patient will report low back and right thigh pain </=  2/10 with all activity to reduce functional limitations  Baseline: patient reports 5/10 pain at rest on evaluation 01/08/2022 INITIAL      PLAN: PT FREQUENCY: 2x/week   PT DURATION: 6 weeks   PLANNED INTERVENTIONS: Therapeutic exercises, Therapeutic activity, Neuro Muscular re-education, Balance training, Gait training, Patient/Family education, Joint mobilization, Aquatic Therapy, Dry Needling, Electrical stimulation, Spinal mobilization, Cryotherapy, Moist heat, and Manual therapy   PLAN FOR NEXT SESSION: assess response to  heel lift?  Review HEP and progress PRN, manual/dry needling for right lumbar paraspinals, continue progression of core stabilization and hip strengthening, initiate lifting mechanics training  Hessie Diener, PTA 12/05/21 12:11 PM Phone: [phone] Fax: [phone]

## 2021-12-07 ENCOUNTER — Telehealth: Payer: Self-pay | Admitting: Physical Therapy

## 2021-12-07 ENCOUNTER — Ambulatory Visit: Payer: Federal, State, Local not specified - PPO | Admitting: Physical Therapy

## 2021-12-07 NOTE — Telephone Encounter (Signed)
Left voicemail regarding  no show to appointment and left next appt date and time. Left reminder of attendance policy.

## 2021-12-10 NOTE — Therapy (Signed)
?OUTPATIENT PHYSICAL THERAPY TREATMENT NOTE ? ? ?Patient Name: Jackie Davis ?MRN: [id number] ?DOB:Jul 22, 1976, 46 y.o., female ?Today's Date: 12/12/2021 ? ?PCP: Pearson Grippe, MD ?REFERRING PROVIDER: Harvest Forest, MD ? ? PT End of Session - 12/12/21 0917   ? ? Visit Number 3   ? Number of Visits 12   ? Date for PT Re-Evaluation 01/08/22   ? Authorization Type BCBS   ? PT Start Time [phone]   ? PT Stop Time [phone]   ? PT Time Calculation (min) 42 min   ? Activity Tolerance Patient tolerated treatment well   ? Behavior During Therapy [facility] for tasks assessed/performed   ? ?  ?  ? ?  ? ? ? ?Past Medical History:  ?Diagnosis Date  ? ADD (attention deficit disorder)   ? Anxiety   ? Hypercholesterolemia   ? Positive PPD   ? STD (sexually transmitted disease)   ? Hx HSV II  ? Vitamin D deficiency   ? ?Past Surgical History:  ?Procedure Laterality Date  ? labial reduction    ? vulvar surgery  ? LIPOSUCTION    ? ?Patient Active Problem List  ? Diagnosis Date Noted  ? Acute low back pain 01/04/2021  ? PCOS (polycystic ovarian syndrome) 06/23/2014  ? Right knee pain 02/10/2014  ? Weight gain 11/29/2013  ? POSITIVE PPD 12/01/2009  ? CRUSHING INJURY, FINGER 12/01/2009  ? ?PCP: Pearson Grippe, MD ?  ?REFERRING PROVIDER: Harvest Forest, MD ?  ?REFERRING DIAG: Chronic low back pain ? ?THERAPY DIAG:  ?Chronic low back pain, unspecified back pain laterality, unspecified whether sciatica present ? ?Muscle weakness (generalized) ? ?PERTINENT HISTORY: None ? ?PRECAUTIONS: None ? ?SUBJECTIVE: Patient reports she has been using the heel lift and she hasn't noticed much difference. She states she hasn't has an acute exacerbation but has a constant pain at lower intensity.  ? ?PAIN:  ?Are you having pain? Yes ?NPRS scale: 2/10 ?Pain location: Back ?Pain orientation: Lower middle, radiates to anterior right thigh ?PAIN TYPE: Chronic ?Pain description: Constant, dull ?Aggravating factors: Lifting or increase in activity ?Relieving factors:  Medication, walking ? ?PATIENT GOALS: Pain relief and improve lifting ability ? ? ?OBJECTIVE:  ?PATIENT SURVEYS:  ?FOTO 47% functional status ?  ?POSTURE:  ?Slight increase in lumbar lordosis ?  ?PALPATION: ?Tenderness to right lumbar paraspinals with increased muscular tension ?  ?LUMBAR AROM ?  ?AROM A/PROM  ?11/27/2021  ?Flexion 75%  ?Extension 50%  ?Right lateral flexion WFL  ?Left lateral flexion 75%*  ?Right rotation WFL  ?Left Rotation WFL  ?Patient reports more discomfort with extension compared to flexion, no directional preference noted ?  ?LE MMT: ?  ?MMT Right ?11/27/2021 Left ?11/27/2021 Right ?12/12/2021  ?Hip flexion 4 4   ?Hip extension 3- 3- 3  ?Hip abduction 4- 4- 4-  ?Knee flexion 5 5   ?Knee extension 5 5   ?Gross core strength 4-   ? ?FUNCTIONAL TESTS:  ?Patient exhibits poor lumbopelvic control when demonstrating lifting technique ?  ?  ?TODAY'S TREATMENT  ?12/12/2021: ?Therapeutic Exercise: ?Recumbent bike L2 x 4 min while taking subjective ?Piriformis stretch 2 x 30 sec each ?LTR 3 x 10 sec  ?Hooklying pelvic tilt 10 x 5 sec hold ?90-90 alternating toe taps 2 x 10 ?Dead bug 2 x 10 (knees bent) ?Sidelying hip abduction 2 x 10 each - pillow placed under side ?Bird dog 2 x 10 ? ? ?12/05/2021: ?Therapeutic Exercise: ?PPT x 10 ?PPT with SLR 10  x 2 ?PPT with blue band clam 10 x 2  ?PPT with alternating blue band clam 10 x 2- cue for technique  ?PPT with bridge (articulating) blue band hold at knees 10 x 2 ?S/L blue band clam 10 x 2, bilat  cues for eccentric control  ?Checked LL 90 cm left, 91 cm right ?Manual Therapy: ?Long axis distraction L and R 10 sec x 3 each - noted LLD  ?Self Care: ?Heel lift added to left shoe- two layers- ? ?11/27/2021: ?Eval Treatment: ?Hooklying pelvic tilt 5 x 5 sec hold - patient cued for breathing ?SLR with posterior pelvic tilt x 5 each ?Bridge articulating x 10 ?Side clamshell with green x 15 each ?  ?PATIENT EDUCATION:  ?Education details: HEP update ?Person educated:  Patient ?Education method: Explanation, Demonstration, Tactile cues, Verbal cues, and Handouts ?Education comprehension: verbalized understanding, returned demonstration, verbal cues required, tactile cues required, and needs further education ?  ?HOME EXERCISE PROGRAM: ?Access Code: [email or web address] ?  ?  ?ASSESSMENT: ?CLINICAL IMPRESSION: ?Patient tolerated therapy well with no adverse effects. Therapy focused on progress of core and hip strengthening. Patient reported increased pain with turning over and with movements in LTR which improved when she was cued to engage abdominals while turning. Patient also reported increased pain with sidelying right hip abduction so placed a pillow under side to maintain neutral spine and cued patient to engage core which improved pain. Her main deficit continued to be core and hip weakness and her pain reduces when she engages the core with movement. Updated HEP this visit with good tolerance. Patient would benefit from continued skilled PT to progress her mobility, strength and control in order to reduce pain and maximize functional ability. ? ?Objective impairments include decreased activity tolerance, decreased ROM, decreased strength, improper body mechanics, and pain.  ?  ?  ?GOALS: ?Goals reviewed with patient? Yes ?  ?SHORT TERM GOALS: ?  ?STG Name Target Date Goal status  ?1 Patient will be I with initial HEP in order to progress with therapy. ?Baseline: progressing HEP 12/18/2021 ONGOING  ?2 PT will review FOTO with patient by 3rd visit in order to understand expected progress and outcome with therapy. ?Baseline: reviewed 12/18/2021 ACHIEVED  ?  ?LONG TERM GOALS:  ?  ?LTG Name Target Date Goal status  ?1 Patient will be I with final HEP to maintain progress from PT. ?Baseline: 01/08/2022 INITIAL  ?2 Patient will report >/= 60% status on FOTO to indicate improved functional ability. ?Baseline: 01/08/2022 INITIAL  ?3 Patient will demonstrate gross core and hip strength >/= 4/5 MMT in  order to improve tolerance for lifting and household activities ?Baseline: patient demonstrates strength deficits of core and hip musculature 01/08/2022 INITIAL  ?4 Patient will report low back and right thigh pain </= 2/10 with all activity to reduce functional limitations  ?Baseline: patient reports 5/10 pain at rest on evaluation 01/08/2022 INITIAL  ?  ?  ?PLAN: ?PT FREQUENCY: 2x/week ?  ?PT DURATION: 6 weeks ?  ?PLANNED INTERVENTIONS: Therapeutic exercises, Therapeutic activity, Neuro Muscular re-education, Balance training, Gait training, Patient/Family education, Joint mobilization, Aquatic Therapy, Dry Needling, Electrical stimulation, Spinal mobilization, Cryotherapy, Moist heat, and Manual therapy ?  ?PLAN FOR NEXT SESSION: Review HEP and progress PRN, manual/dry needling for right lumbar paraspinals, continue progression of core stabilization and hip strengthening, initiate lifting mechanics training, pilates ? ? ?Hilda Blades, PT, DPT, LAT, ATC ?12/12/21  10:05 AM ?Phone: [phone] ?Fax: [phone] ? ? ?   ?

## 2021-12-12 ENCOUNTER — Ambulatory Visit: Payer: Federal, State, Local not specified - PPO | Attending: Internal Medicine | Admitting: Physical Therapy

## 2021-12-12 ENCOUNTER — Other Ambulatory Visit: Payer: Self-pay

## 2021-12-12 ENCOUNTER — Encounter: Payer: Self-pay | Admitting: Physical Therapy

## 2021-12-12 DIAGNOSIS — G8929 Other chronic pain: Secondary | ICD-10-CM | POA: Insufficient documentation

## 2021-12-12 DIAGNOSIS — M6281 Muscle weakness (generalized): Secondary | ICD-10-CM | POA: Insufficient documentation

## 2021-12-12 DIAGNOSIS — M545 Low back pain, unspecified: Secondary | ICD-10-CM | POA: Diagnosis not present

## 2021-12-14 ENCOUNTER — Ambulatory Visit: Payer: Federal, State, Local not specified - PPO | Admitting: Physical Therapy

## 2021-12-19 ENCOUNTER — Ambulatory Visit: Payer: Federal, State, Local not specified - PPO | Admitting: Physical Therapy

## 2021-12-19 ENCOUNTER — Other Ambulatory Visit: Payer: Self-pay

## 2021-12-19 ENCOUNTER — Encounter: Payer: Self-pay | Admitting: Physical Therapy

## 2021-12-19 DIAGNOSIS — G8929 Other chronic pain: Secondary | ICD-10-CM | POA: Diagnosis not present

## 2021-12-19 DIAGNOSIS — M6281 Muscle weakness (generalized): Secondary | ICD-10-CM

## 2021-12-19 DIAGNOSIS — M545 Low back pain, unspecified: Secondary | ICD-10-CM | POA: Diagnosis not present

## 2021-12-19 NOTE — Therapy (Signed)
OUTPATIENT PHYSICAL THERAPY TREATMENT NOTE   Patient Name: Jackie Davis MRN: [id number] DOB:1976/01/24, 46 y.o., female Today's Date: 12/19/2021  PCP: Pearson GrippeKim, James, MD REFERRING PROVIDER: Harvest ForestBakare, Mobolaji B, MD   PT End of Session - 12/19/21 0849     Visit Number 4    Number of Visits 12    Date for PT Re-Evaluation 01/08/22    Authorization Type BCBS    PT Start Time 0847    PT Stop Time 0930    PT Time Calculation (min) 43 min              Past Medical History:  Diagnosis Date   ADD (attention deficit disorder)    Anxiety    Hypercholesterolemia    Positive PPD    STD (sexually transmitted disease)    Hx HSV II   Vitamin D deficiency    Past Surgical History:  Procedure Laterality Date   labial reduction     vulvar surgery   LIPOSUCTION     Patient Active Problem List   Diagnosis Date Noted   Acute low back pain 01/04/2021   PCOS (polycystic ovarian syndrome) 06/23/2014   Right knee pain 02/10/2014   Weight gain 11/29/2013   POSITIVE PPD 12/01/2009   CRUSHING INJURY, FINGER 12/01/2009   PCP: Pearson GrippeKim, James, MD   REFERRING PROVIDER: Harvest ForestBakare, Mobolaji B, MD   REFERRING DIAG: Chronic low back pain  THERAPY DIAG:  Chronic low back pain, unspecified back pain laterality, unspecified whether sciatica present  Muscle weakness (generalized)  PERTINENT HISTORY: None  PRECAUTIONS: None  SUBJECTIVE: Patient reports no overall change. Performing HEP 3-4 times per week.   PAIN:  Are you having pain? Yes NPRS scale: 2/10 Pain location: Back Pain orientation: Lower middle, radiates to anterior right thigh PAIN TYPE: Chronic Pain description: Constant, dull Aggravating factors: Lifting or increase in activity Relieving factors: Medication, walking  PATIENT GOALS: Pain relief and improve lifting ability   OBJECTIVE:  PATIENT SURVEYS:  FOTO 47% functional status   POSTURE:  Slight increase in lumbar lordosis   PALPATION: Tenderness to right  lumbar paraspinals with increased muscular tension   LUMBAR AROM   AROM A/PROM  11/27/2021  Flexion 75%  Extension 50%  Right lateral flexion WFL  Left lateral flexion 75%*  Right rotation [facility]  Left Rotation [facility]  Patient reports more discomfort with extension compared to flexion, no directional preference noted   LE MMT:   MMT Right 11/27/2021 Left 11/27/2021 Right 12/12/2021  Hip flexion 4 4   Hip extension 3- 3- 3  Hip abduction 4- 4- 4-  Knee flexion 5 5   Knee extension 5 5   Gross core strength 4-    FUNCTIONAL TESTS:  Patient exhibits poor lumbopelvic control when demonstrating lifting technique     TODAY'S TREATMENT  12/19/2021: Therapeutic Exercise: Recumbent bike L2 x 6 min while taking subjective Bird dog 1 x 10-fatigues Fire hydrants in qped x 10 ea  Donkey kicks in qped x 10 ea Plank on knees and forearms x 30 sec Childs pose with laterals  POE stretch with slight press ups Dead bug 2 x 10 (knees bent) Sidelying hip abduction 2 x 10 each - on elbow with cues for form    12/12/2021: Therapeutic Exercise: Recumbent bike L2 x 4 min while taking subjective Piriformis stretch 2 x 30 sec each LTR 3 x 10 sec  Hooklying pelvic tilt 10 x 5 sec hold 90-90 alternating toe taps 2 x 10  Dead bug 2 x 10 (knees bent) Sidelying hip abduction 2 x 10 each - pillow placed under side Bird dog 2 x 10   12/05/2021: Therapeutic Exercise: PPT x 10 PPT with SLR 10 x 2 PPT with blue band clam 10 x 2  PPT with alternating blue band clam 10 x 2- cue for technique  PPT with bridge (articulating) blue band hold at knees 10 x 2 S/L blue band clam 10 x 2, bilat  cues for eccentric control  Checked LL 90 cm left, 91 cm right Manual Therapy: Long axis distraction L and R 10 sec x 3 each - noted LLD  Self Care: Heel lift added to left shoe- two layers-  11/27/2021: Eval Treatment: Hooklying pelvic tilt 5 x 5 sec hold - patient cued for breathing SLR with posterior pelvic tilt x  5 each Bridge articulating x 10 Side clamshell with green x 15 each   PATIENT EDUCATION:  Education details: HEP update Person educated: Patient Education method: Explanation, Demonstration, Tactile cues, Verbal cues, and Handouts Education comprehension: verbalized understanding, returned demonstration, verbal cues required, tactile cues required, and needs further education   HOME EXERCISE PROGRAM: Access Code: [email or web address] URL: [email or web address] Date: 12/19/2021 Prepared by: Jannette Spanner  Exercises Supine Bridge with Spinal Articulation - 1 x daily - 3 sets - 10 reps - 3 seconds hold Sidelying Hip Abduction - 1 x daily - 3 sets - 10 reps Dead Bug - 1 x daily - 3 sets - 10 reps Bird Dog - 1 x daily - 3 sets - 10 reps - 3 seconds hold      ASSESSMENT: CLINICAL IMPRESSION: Patient reports pain fluctuates and she believes that she does something that increases her pain but she is unsure. She is performing HEP every other day. Education provided on daily HEP to build strength. Continued with hip and core strength and stability focus with education on weakness and pain. She fatigues in quadruped and does well with forearms supported with bolster  Patient tolerated therapy well with no adverse effects. Patient would benefit from continued skilled PT to progress her mobility, strength and control in order to reduce pain and maximize functional ability. She is reducing frequency to 1 x per week due to schedule conflicts. Will plan to try Pilates based core/ reformer over next two sessions.   Objective impairments include decreased activity tolerance, decreased ROM, decreased strength, improper body mechanics, and pain.      GOALS: Goals reviewed with patient? Yes   SHORT TERM GOALS:   STG Name Target Date Goal status  1 Patient will be I with initial HEP in order to progress with therapy. Baseline: progressing HEP Status 12/19/21: performing HEP every other day 12/18/2021  ONGOING  2 PT will review FOTO with patient by 3rd visit in order to understand expected progress and outcome with therapy. Baseline: reviewed 12/18/2021 ACHIEVED    LONG TERM GOALS:    LTG Name Target Date Goal status  1 Patient will be I with final HEP to maintain progress from PT. Baseline: 01/08/2022 INITIAL  2 Patient will report >/= 60% status on FOTO to indicate improved functional ability. Baseline: 01/08/2022 INITIAL  3 Patient will demonstrate gross core and hip strength >/= 4/5 MMT in order to improve tolerance for lifting and household activities Baseline: patient demonstrates strength deficits of core and hip musculature 01/08/2022 INITIAL  4 Patient will report low back and right thigh pain </= 2/10 with all activity to reduce functional limitations  Baseline: patient reports 5/10 pain at rest on evaluation 01/08/2022 INITIAL      PLAN: PT FREQUENCY: 2x/week   PT DURATION: 6 weeks   PLANNED INTERVENTIONS: Therapeutic exercises, Therapeutic activity, Neuro Muscular re-education, Balance training, Gait training, Patient/Family education, Joint mobilization, Aquatic Therapy, Dry Needling, Electrical stimulation, Spinal mobilization, Cryotherapy, Moist heat, and Manual therapy   PLAN FOR NEXT SESSION: Pilates/ reformer trial.continue progression of core stabilization and hip strengthening, initiate lifting mechanics training    Jannette Spanner, PTA 12/19/21 12:17 PM Phone: [phone] Fax: [phone]

## 2021-12-20 DIAGNOSIS — E669 Obesity, unspecified: Secondary | ICD-10-CM | POA: Diagnosis not present

## 2021-12-20 DIAGNOSIS — M545 Low back pain, unspecified: Secondary | ICD-10-CM | POA: Diagnosis not present

## 2021-12-20 DIAGNOSIS — Z6830 Body mass index (BMI) 30.0-30.9, adult: Secondary | ICD-10-CM | POA: Diagnosis not present

## 2021-12-20 DIAGNOSIS — E785 Hyperlipidemia, unspecified: Secondary | ICD-10-CM | POA: Diagnosis not present

## 2021-12-21 ENCOUNTER — Ambulatory Visit: Payer: Federal, State, Local not specified - PPO | Admitting: Physical Therapy

## 2021-12-21 ENCOUNTER — Telehealth: Payer: Self-pay | Admitting: Physical Therapy

## 2021-12-21 NOTE — Telephone Encounter (Signed)
Contacted patient regarding no show. Left voicemail with next appointment dates and times. Requested a call back if she needs to cancel or reschedule.  ?

## 2021-12-26 ENCOUNTER — Ambulatory Visit: Payer: Federal, State, Local not specified - PPO | Admitting: Physical Therapy

## 2021-12-28 ENCOUNTER — Ambulatory Visit: Payer: Federal, State, Local not specified - PPO | Admitting: Physical Therapy

## 2021-12-28 ENCOUNTER — Encounter: Payer: Federal, State, Local not specified - PPO | Admitting: Physical Therapy

## 2022-01-17 DIAGNOSIS — G44209 Tension-type headache, unspecified, not intractable: Secondary | ICD-10-CM | POA: Diagnosis not present

## 2022-01-17 DIAGNOSIS — R12 Heartburn: Secondary | ICD-10-CM | POA: Diagnosis not present

## 2022-01-17 DIAGNOSIS — M545 Low back pain, unspecified: Secondary | ICD-10-CM | POA: Diagnosis not present

## 2022-01-17 DIAGNOSIS — E669 Obesity, unspecified: Secondary | ICD-10-CM | POA: Diagnosis not present

## 2022-02-12 DIAGNOSIS — Z6829 Body mass index (BMI) 29.0-29.9, adult: Secondary | ICD-10-CM | POA: Diagnosis not present

## 2022-02-12 DIAGNOSIS — G4700 Insomnia, unspecified: Secondary | ICD-10-CM | POA: Diagnosis not present

## 2022-02-12 DIAGNOSIS — E669 Obesity, unspecified: Secondary | ICD-10-CM | POA: Diagnosis not present

## 2022-02-12 DIAGNOSIS — R12 Heartburn: Secondary | ICD-10-CM | POA: Diagnosis not present

## 2022-03-13 DIAGNOSIS — E785 Hyperlipidemia, unspecified: Secondary | ICD-10-CM | POA: Diagnosis not present

## 2022-03-14 DIAGNOSIS — E669 Obesity, unspecified: Secondary | ICD-10-CM | POA: Diagnosis not present

## 2022-03-14 DIAGNOSIS — R12 Heartburn: Secondary | ICD-10-CM | POA: Diagnosis not present

## 2022-03-14 DIAGNOSIS — E559 Vitamin D deficiency, unspecified: Secondary | ICD-10-CM | POA: Diagnosis not present

## 2022-03-14 DIAGNOSIS — Z6829 Body mass index (BMI) 29.0-29.9, adult: Secondary | ICD-10-CM | POA: Diagnosis not present

## 2022-04-03 DIAGNOSIS — E669 Obesity, unspecified: Secondary | ICD-10-CM | POA: Diagnosis not present

## 2022-04-03 DIAGNOSIS — Z733 Stress, not elsewhere classified: Secondary | ICD-10-CM | POA: Diagnosis not present

## 2022-04-03 DIAGNOSIS — R12 Heartburn: Secondary | ICD-10-CM | POA: Diagnosis not present

## 2022-04-03 DIAGNOSIS — Z6829 Body mass index (BMI) 29.0-29.9, adult: Secondary | ICD-10-CM | POA: Diagnosis not present

## 2022-04-23 DIAGNOSIS — F411 Generalized anxiety disorder: Secondary | ICD-10-CM | POA: Diagnosis not present

## 2022-04-23 DIAGNOSIS — Z6829 Body mass index (BMI) 29.0-29.9, adult: Secondary | ICD-10-CM | POA: Diagnosis not present

## 2022-04-23 DIAGNOSIS — E669 Obesity, unspecified: Secondary | ICD-10-CM | POA: Diagnosis not present

## 2022-04-23 DIAGNOSIS — G4709 Other insomnia: Secondary | ICD-10-CM | POA: Diagnosis not present

## 2022-06-03 DIAGNOSIS — K5900 Constipation, unspecified: Secondary | ICD-10-CM | POA: Diagnosis not present

## 2022-06-03 DIAGNOSIS — R42 Dizziness and giddiness: Secondary | ICD-10-CM | POA: Diagnosis not present

## 2022-06-03 DIAGNOSIS — F411 Generalized anxiety disorder: Secondary | ICD-10-CM | POA: Diagnosis not present

## 2022-06-03 DIAGNOSIS — E669 Obesity, unspecified: Secondary | ICD-10-CM | POA: Diagnosis not present

## 2022-06-11 DIAGNOSIS — R509 Fever, unspecified: Secondary | ICD-10-CM | POA: Diagnosis not present

## 2022-06-11 DIAGNOSIS — J029 Acute pharyngitis, unspecified: Secondary | ICD-10-CM | POA: Diagnosis not present

## 2022-06-11 DIAGNOSIS — R590 Localized enlarged lymph nodes: Secondary | ICD-10-CM | POA: Diagnosis not present

## 2022-06-11 DIAGNOSIS — R52 Pain, unspecified: Secondary | ICD-10-CM | POA: Diagnosis not present

## 2022-08-16 DIAGNOSIS — Z0001 Encounter for general adult medical examination with abnormal findings: Secondary | ICD-10-CM | POA: Diagnosis not present

## 2022-08-16 DIAGNOSIS — Z136 Encounter for screening for cardiovascular disorders: Secondary | ICD-10-CM | POA: Diagnosis not present

## 2022-08-22 DIAGNOSIS — Z1211 Encounter for screening for malignant neoplasm of colon: Secondary | ICD-10-CM | POA: Diagnosis not present

## 2022-08-22 DIAGNOSIS — Z01419 Encounter for gynecological examination (general) (routine) without abnormal findings: Secondary | ICD-10-CM | POA: Diagnosis not present

## 2022-08-22 DIAGNOSIS — Z6829 Body mass index (BMI) 29.0-29.9, adult: Secondary | ICD-10-CM | POA: Diagnosis not present

## 2022-08-22 DIAGNOSIS — Z0001 Encounter for general adult medical examination with abnormal findings: Secondary | ICD-10-CM | POA: Diagnosis not present

## 2022-08-22 DIAGNOSIS — R109 Unspecified abdominal pain: Secondary | ICD-10-CM | POA: Diagnosis not present

## 2022-08-22 DIAGNOSIS — F520 Hypoactive sexual desire disorder: Secondary | ICD-10-CM | POA: Diagnosis not present

## 2022-08-22 DIAGNOSIS — Z23 Encounter for immunization: Secondary | ICD-10-CM | POA: Diagnosis not present

## 2022-08-22 DIAGNOSIS — E669 Obesity, unspecified: Secondary | ICD-10-CM | POA: Diagnosis not present

## 2022-09-25 ENCOUNTER — Telehealth: Payer: Self-pay | Admitting: Primary Care

## 2022-09-25 MED ORDER — NIRMATRELVIR/RITONAVIR (PAXLOVID)TABLET: 3.0000 | ORAL_TABLET | Freq: Two times a day (BID) | ORAL | 0 refills | Status: AC

## 2022-09-25 NOTE — Addendum Note (Signed)
Addended by: Glenford Bayley on: 09/25/2022 04:17 PM   Modules accepted: Orders

## 2022-09-25 NOTE — Telephone Encounter (Addendum)
Patient tested positive for covid today. She was last seen by our office in Nov 2022. She has taken Paxlovid in the past without issue. Sending in RX to [facility].

## 2022-09-30 ENCOUNTER — Ambulatory Visit (INDEPENDENT_AMBULATORY_CARE_PROVIDER_SITE_OTHER): Payer: Federal, State, Local not specified - PPO

## 2022-09-30 ENCOUNTER — Encounter: Payer: Self-pay | Admitting: Primary Care

## 2022-09-30 ENCOUNTER — Ambulatory Visit: Payer: Federal, State, Local not specified - PPO | Admitting: Primary Care

## 2022-09-30 VITALS — BP 112/74 | HR 98 | Temp 98.2°F | Ht 67.5 in | Wt 152.2 lb

## 2022-09-30 DIAGNOSIS — U071 COVID-19: Secondary | ICD-10-CM

## 2022-09-30 DIAGNOSIS — R059 Cough, unspecified: Secondary | ICD-10-CM | POA: Diagnosis not present

## 2022-09-30 MED ORDER — ALBUTEROL SULFATE HFA 108 (90 BASE) MCG/ACT IN AERS: 2.0000 | INHALATION_SPRAY | Freq: Four times a day (QID) | RESPIRATORY_TRACT | 0 refills | Status: DC | PRN

## 2022-09-30 NOTE — Assessment & Plan Note (Signed)
-   Tested positive for Covid on 09/25/22. Cough is productive with green mucus. No associated shortness of breath or chst tightness. Today is her last day of paxlovid, started course of Cephalexin. Exam was benign. Lungs clear, O2 100% RA. Advised she take mucinex-dm 600mg  twice daily and fluticasone nasal spray daily until congestion improves. We will check CXR. Sending in RX Albuterol hfa to have on hand in case she develops sob/chest tightness symptoms.

## 2022-09-30 NOTE — Progress Notes (Signed)
Can you please let patient know CXR showed normal

## 2022-09-30 NOTE — Patient Instructions (Addendum)
Recommendations: - Start mucinex-dm 600mg  twice daily until cough is better  - Start flonase nasal spray daily until nasal congestion is better  - Use albuterol 2 puff every 4-6 hours for shortness of breath/wheezing - If you develop chest discomfort or shortness of breath please let me know - Also check and see what antibiotic you are taking and send me a mychart message with the name   Orders: - CXR today re: covid-19  Follow-up: - As needed if symptoms do not improved

## 2022-09-30 NOTE — Progress Notes (Signed)
@Patient  ID: , female    DOB: [birth year], 46 y.o.   MRN: [id number]  Chief Complaint  Patient presents with   Acute Visit    Cough with green sputum x 2 days.  Dx with COVID 09/25/2022  Dr 09/27/2022 rx anitbiotic yesterday. Does not remember name.    Referring provider: Marchelle Gearing, MD  HPI: 46 year old female, never smoked. PMH significant for PCOS, heartburn, weight gain.  09/30/2022 Patient presents today for visit. She developed symptoms fatigue, body aches, nasal congestion and cough last week. She tested positive for covid on Wednesday 12/13. She took last day of paxlovid today and is newly on Cephalexin. She is feeling better today. Cough is now loose. She is getting up thick green mucus. She has not tried any OTC medications for cough. She feels well other than cough. She is not having any associated shortness of breath or chest tightness. She did experience some increased anxiety symptoms this past week d/t covid diagnosis. She is eating and drinking normally. She has lost her sense of smell.    No Known Allergies  Immunization History  Administered Date(s) Administered   H1N1 09/14/2008   HPV 9-valent 09/06/2019   Influenza Inj Mdck Quad Pf 08/06/2019   Influenza Whole 08/15/2008   Influenza,inj,Quad PF,6+ Mos 09/12/2020   Influenza-Unspecified 08/08/2012, 07/17/2016   PFIZER(Purple Top)SARS-COV-2 Vaccination 01/06/2020, 01/27/2020, 09/12/2020   Tdap 11/10/2012    Past Medical History:  Diagnosis Date   ADD (attention deficit disorder)    Anxiety    Hypercholesterolemia    Positive PPD    STD (sexually transmitted disease)    Hx HSV II   Vitamin D deficiency     Tobacco History: Social History   Tobacco Use  Smoking Status Never  Smokeless Tobacco Never   Counseling given: Not Answered   Outpatient Medications Prior to Visit  Medication Sig Dispense Refill   ADZENYS XR-ODT 18.8 MG TBED Take 2 tablets by mouth every morning.      nirmatrelvir/ritonavir EUA (PAXLOVID) 20 x 150 MG & 10 x 100MG  TABS Take 3 tablets by mouth 2 (two) times daily for 5 days. Take nirmatrelvir (150 mg) two tablets twice daily for 5 days and ritonavir (100 mg) one tablet twice daily for 5 days. 30 tablet 0   Semaglutide-Weight Management (WEGOVY [place]) Inject into the skin.     No facility-administered medications prior to visit.   Review of Systems  Review of Systems  Constitutional: Negative.   HENT:  Positive for congestion.   Respiratory:  Positive for cough. Negative for chest tightness, shortness of breath and wheezing.   Cardiovascular: Negative.     Physical Exam  BP 112/74 (BP Location: Right Arm, Patient Position: Sitting, Cuff Size: Normal)   Pulse 98   Temp 98.2 F (36.8 C) (Oral)   Ht 5' 7.5" (1.715 m)   Wt 152 lb 3.2 oz (69 kg)   SpO2 100%   BMI 23.49 kg/m  Physical Exam Constitutional:      General: She is not in acute distress.    Appearance: Normal appearance. She is not ill-appearing.  HENT:     Head: Normocephalic and atraumatic.     Mouth/Throat:     Mouth: Mucous membranes are moist.     Pharynx: Oropharynx is clear. Uvula midline. No oropharyngeal exudate, posterior oropharyngeal erythema or uvula swelling.     Tonsils: 2+ on the right. 2+ on the left.  Cardiovascular:     Rate  and Rhythm: Normal rate and regular rhythm.  Pulmonary:     Effort: Pulmonary effort is normal. No respiratory distress.     Breath sounds: Normal breath sounds. No wheezing, rhonchi or rales.     Comments: Lungs clear. No respiratory distress. Occasional congested cough.  Musculoskeletal:        General: Normal range of motion.  Skin:    General: Skin is warm and dry.  Neurological:     General: No focal deficit present.     Mental Status: She is alert and oriented to person, place, and time. Mental status is at baseline.  Psychiatric:        Mood and Affect: Mood normal.        Behavior: Behavior normal.        Thought  Content: Thought content normal.        Judgment: Judgment normal.      Lab Results:  CBC No results found for: "WBC", "RBC", "HGB", "HCT", "PLT", "MCV", "MCH", "MCHC", "RDW", "LYMPHSABS", "MONOABS", "EOSABS", "BASOSABS"  BMET No results found for: "NA", "K", "CL", "CO2", "GLUCOSE", "BUN", "CREATININE", "CALCIUM", "GFRNONAA", "GFRAA"  BNP No results found for: "BNP"  ProBNP No results found for: "PROBNP"  Imaging: DG Chest 2 View  Result Date: 09/30/2022 CLINICAL DATA:  Covid-19, productive cough EXAM: CHEST - 2 VIEW COMPARISON:  None Available. FINDINGS: The cardiomediastinal silhouette is normal in contour. No pleural effusion. No pneumothorax. No acute pleuroparenchymal abnormality. Visualized abdomen is unremarkable. No acute osseous abnormality noted. IMPRESSION: No acute cardiopulmonary abnormality. Electronically Signed   By: Meda Klinefelter M.D.   On: 09/30/2022 12:40     Assessment & Plan:   COVID-19 virus infection - Tested positive for Covid on 09/25/22. Cough is productive with green mucus. No associated shortness of breath or chst tightness. Today is her last day of paxlovid, started course of Cephalexin. Exam was benign. Lungs clear, O2 100% RA. Advised she take mucinex-dm 600mg  twice daily and fluticasone nasal spray daily until congestion improves. We will check CXR. Sending in RX Albuterol hfa to have on hand in case she develops sob/chest tightness symptoms.    , NP 09/30/2022

## 2022-10-02 NOTE — Progress Notes (Signed)
Called patient.  Left message for pt to call back regarding results.

## 2022-10-22 ENCOUNTER — Other Ambulatory Visit: Payer: Self-pay | Admitting: Primary Care

## 2022-11-12 DIAGNOSIS — R030 Elevated blood-pressure reading, without diagnosis of hypertension: Secondary | ICD-10-CM | POA: Diagnosis not present

## 2022-11-12 DIAGNOSIS — F520 Hypoactive sexual desire disorder: Secondary | ICD-10-CM | POA: Diagnosis not present

## 2022-11-12 DIAGNOSIS — E669 Obesity, unspecified: Secondary | ICD-10-CM | POA: Diagnosis not present

## 2022-11-12 DIAGNOSIS — Z23 Encounter for immunization: Secondary | ICD-10-CM | POA: Diagnosis not present

## 2022-12-16 DIAGNOSIS — L811 Chloasma: Secondary | ICD-10-CM | POA: Diagnosis not present

## 2022-12-16 DIAGNOSIS — L718 Other rosacea: Secondary | ICD-10-CM | POA: Diagnosis not present

## 2023-02-03 DIAGNOSIS — L718 Other rosacea: Secondary | ICD-10-CM | POA: Diagnosis not present

## 2023-02-03 DIAGNOSIS — L811 Chloasma: Secondary | ICD-10-CM | POA: Diagnosis not present

## 2023-02-11 DIAGNOSIS — J00 Acute nasopharyngitis [common cold]: Secondary | ICD-10-CM | POA: Diagnosis not present

## 2023-02-11 DIAGNOSIS — F520 Hypoactive sexual desire disorder: Secondary | ICD-10-CM | POA: Diagnosis not present

## 2023-02-11 DIAGNOSIS — E669 Obesity, unspecified: Secondary | ICD-10-CM | POA: Diagnosis not present

## 2023-02-11 DIAGNOSIS — Z6829 Body mass index (BMI) 29.0-29.9, adult: Secondary | ICD-10-CM | POA: Diagnosis not present

## 2023-06-23 DIAGNOSIS — J3089 Other allergic rhinitis: Secondary | ICD-10-CM | POA: Diagnosis not present

## 2023-06-23 DIAGNOSIS — J301 Allergic rhinitis due to pollen: Secondary | ICD-10-CM | POA: Diagnosis not present

## 2023-06-23 DIAGNOSIS — R052 Subacute cough: Secondary | ICD-10-CM | POA: Diagnosis not present

## 2023-06-23 DIAGNOSIS — K219 Gastro-esophageal reflux disease without esophagitis: Secondary | ICD-10-CM | POA: Diagnosis not present

## 2023-06-24 DIAGNOSIS — F520 Hypoactive sexual desire disorder: Secondary | ICD-10-CM | POA: Diagnosis not present

## 2023-06-24 DIAGNOSIS — Z6829 Body mass index (BMI) 29.0-29.9, adult: Secondary | ICD-10-CM | POA: Diagnosis not present

## 2023-06-24 DIAGNOSIS — E669 Obesity, unspecified: Secondary | ICD-10-CM | POA: Diagnosis not present

## 2023-06-24 DIAGNOSIS — G4709 Other insomnia: Secondary | ICD-10-CM | POA: Diagnosis not present

## 2023-07-14 DIAGNOSIS — L718 Other rosacea: Secondary | ICD-10-CM | POA: Diagnosis not present

## 2023-07-14 DIAGNOSIS — L811 Chloasma: Secondary | ICD-10-CM | POA: Diagnosis not present

## 2023-08-26 DIAGNOSIS — F411 Generalized anxiety disorder: Secondary | ICD-10-CM | POA: Diagnosis not present

## 2023-08-26 DIAGNOSIS — Z1231 Encounter for screening mammogram for malignant neoplasm of breast: Secondary | ICD-10-CM | POA: Diagnosis not present

## 2023-08-26 DIAGNOSIS — E785 Hyperlipidemia, unspecified: Secondary | ICD-10-CM | POA: Diagnosis not present

## 2023-08-26 DIAGNOSIS — Z0001 Encounter for general adult medical examination with abnormal findings: Secondary | ICD-10-CM | POA: Diagnosis not present

## 2023-08-26 DIAGNOSIS — G4709 Other insomnia: Secondary | ICD-10-CM | POA: Diagnosis not present

## 2023-08-26 DIAGNOSIS — Z1211 Encounter for screening for malignant neoplasm of colon: Secondary | ICD-10-CM | POA: Diagnosis not present

## 2023-08-26 DIAGNOSIS — Z23 Encounter for immunization: Secondary | ICD-10-CM | POA: Diagnosis not present

## 2023-08-26 DIAGNOSIS — E669 Obesity, unspecified: Secondary | ICD-10-CM | POA: Diagnosis not present

## 2023-08-26 DIAGNOSIS — F520 Hypoactive sexual desire disorder: Secondary | ICD-10-CM | POA: Diagnosis not present

## 2023-08-26 DIAGNOSIS — Z131 Encounter for screening for diabetes mellitus: Secondary | ICD-10-CM | POA: Diagnosis not present

## 2023-11-03 IMAGING — MG MM DIGITAL SCREENING BILAT W/ TOMO AND CAD
8 series · 8 of 24 positions shown · non-contrast
Comparison: None.

CLINICAL DATA: Screening.

EXAM:
DIGITAL SCREENING BILATERAL MAMMOGRAM WITH TOMOSYNTHESIS AND CAD
TECHNIQUE: Bilateral screening digital craniocaudal and mediolateral oblique
mammograms were obtained. Bilateral screening digital breast
tomosynthesis was performed. The images were evaluated with
computer-aided detection.

[R CC synth-2D]
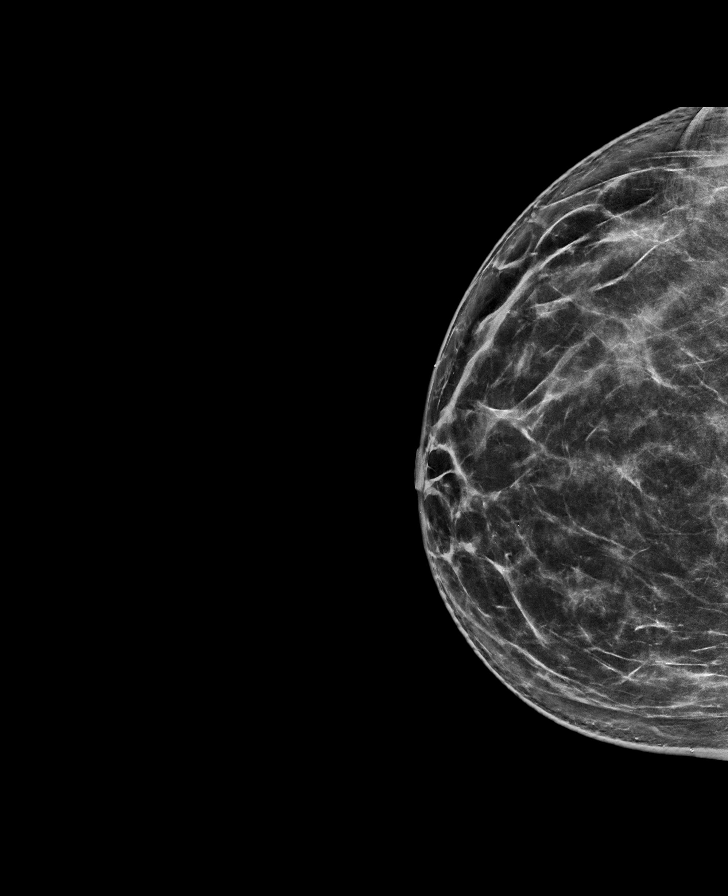

[L CC synth-2D]
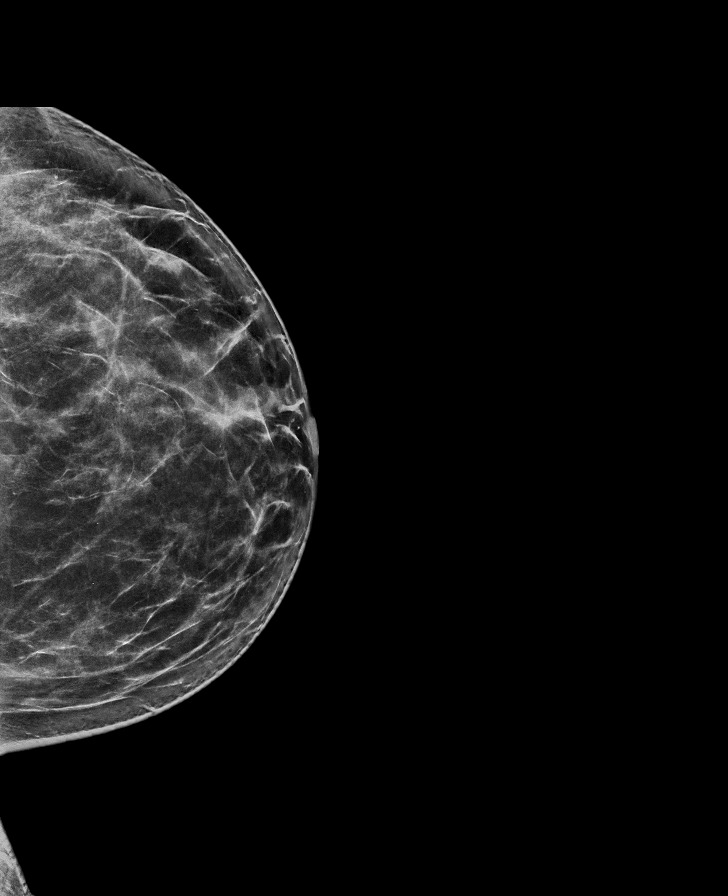

[R MLO synth-2D]
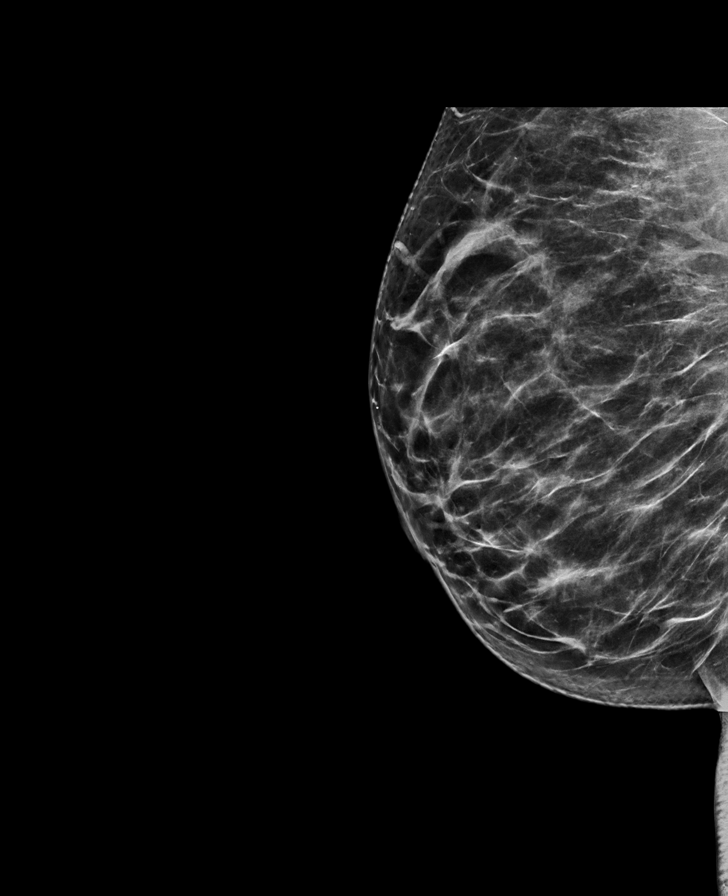

[L MLO synth-2D]
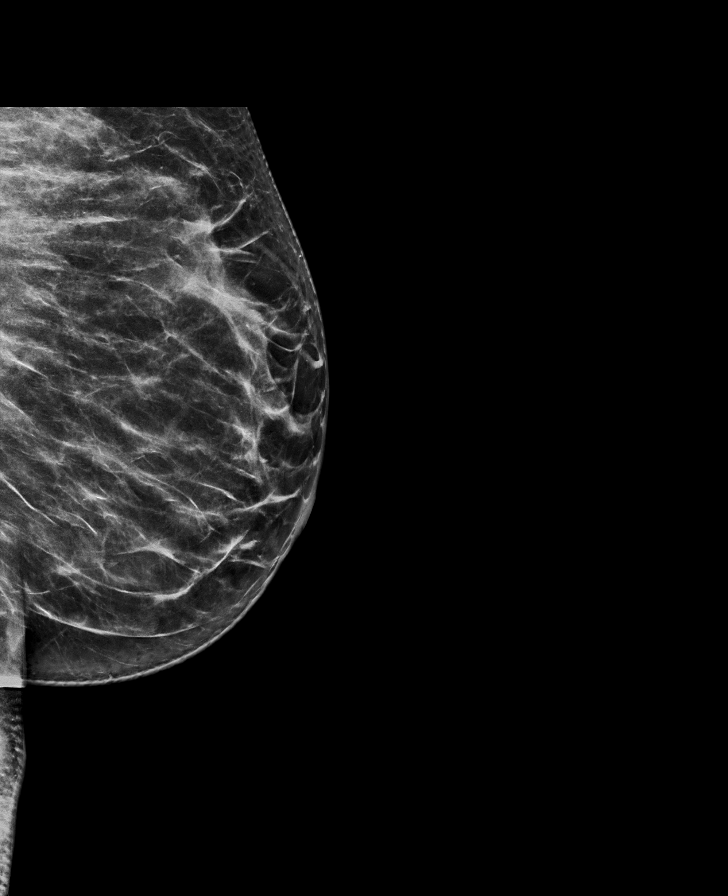

[L MLO tomo · tomo slice 39/77.0]
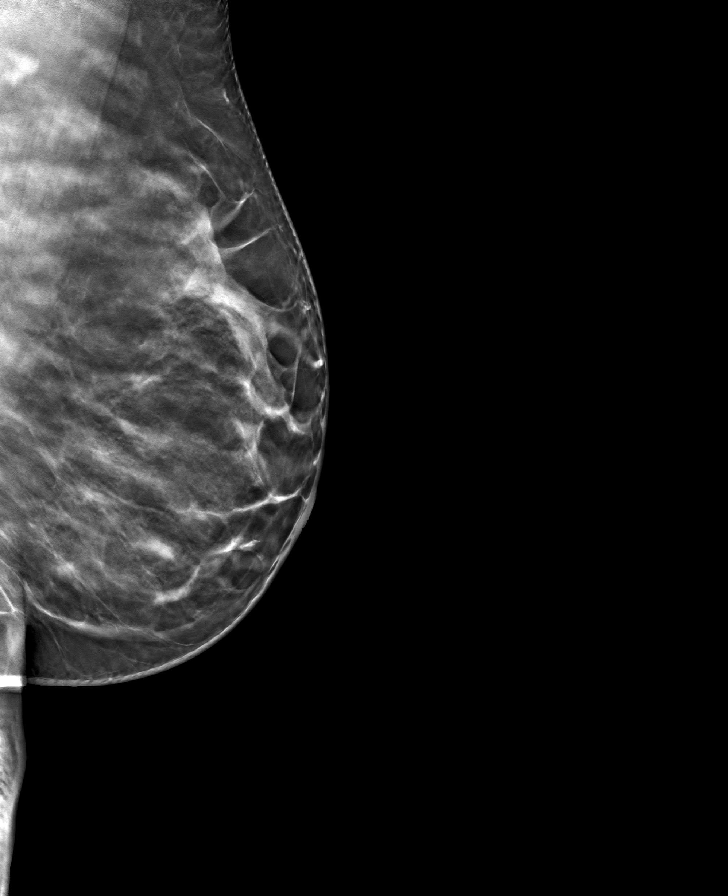

[R MLO tomo · tomo slice 39/76.0]
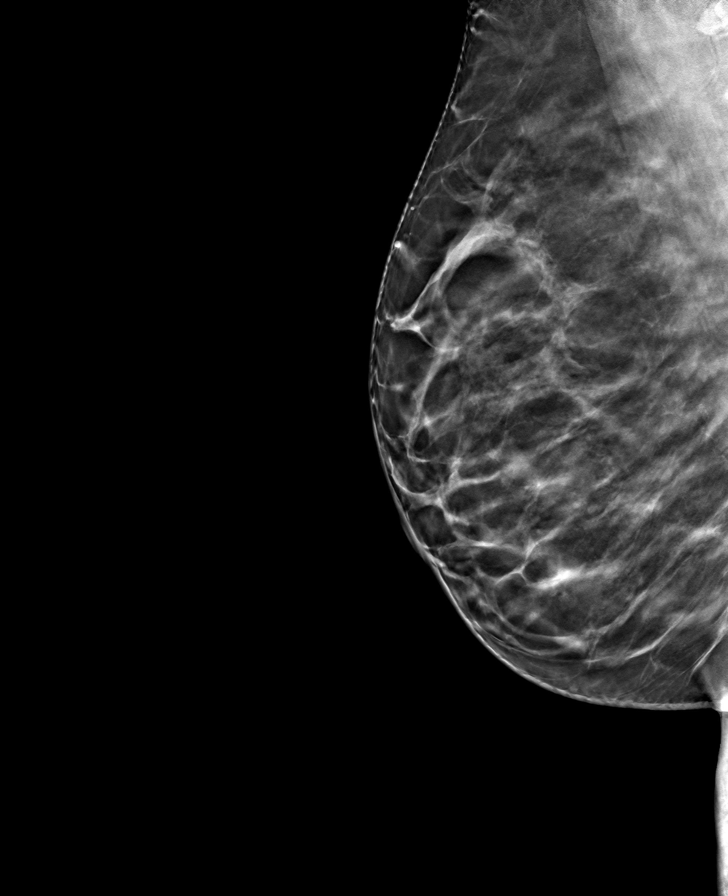

[L CC tomo · tomo slice 40/79.0]
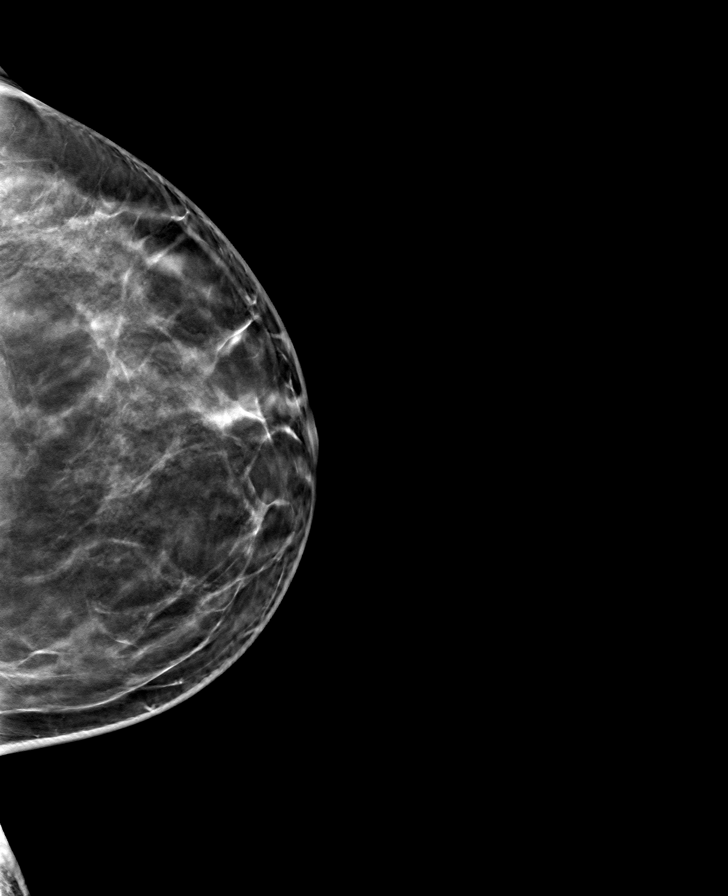

[R CC tomo · tomo slice 41/82.0]
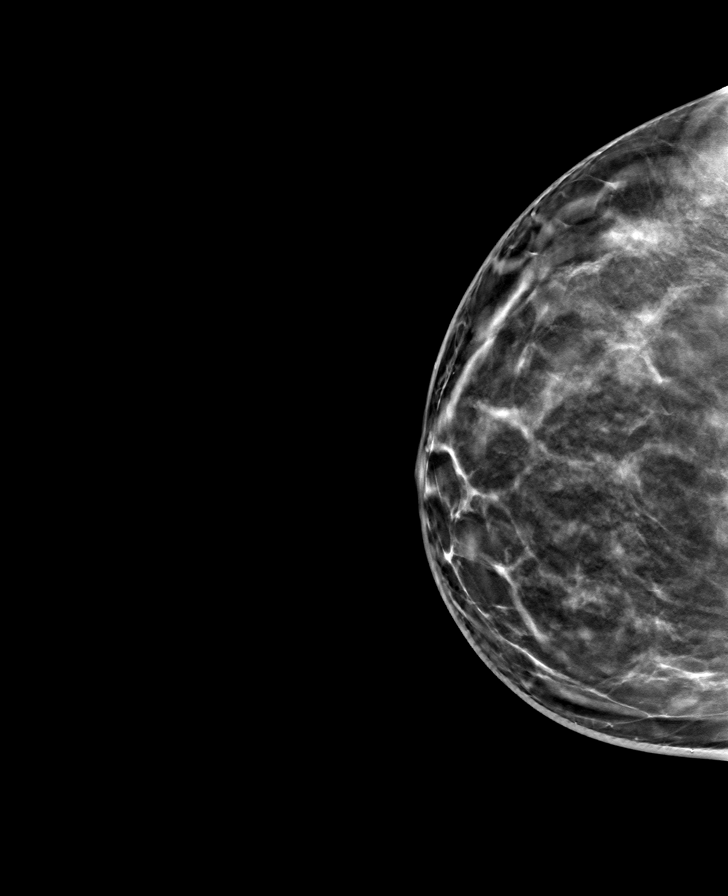

[8 of 24 positions shown; findings below may reference images not displayed]

ACR Breast Density Category b: There are scattered areas of
fibroglandular density.
FINDINGS: There are no findings suspicious for malignancy.
IMPRESSION: No mammographic evidence of malignancy. A result letter of this
screening mammogram will be mailed directly to the patient.

RECOMMENDATION:
Screening mammogram in one year. (Code:XG-X-X7B)

BI-RADS CATEGORY  1: Negative.

## 2023-11-20 ENCOUNTER — Other Ambulatory Visit: Payer: Self-pay | Admitting: Family Medicine

## 2023-11-20 DIAGNOSIS — Z975 Presence of (intrauterine) contraceptive device: Secondary | ICD-10-CM | POA: Diagnosis not present

## 2023-11-20 DIAGNOSIS — Z733 Stress, not elsewhere classified: Secondary | ICD-10-CM | POA: Diagnosis not present

## 2023-11-20 DIAGNOSIS — Z1231 Encounter for screening mammogram for malignant neoplasm of breast: Secondary | ICD-10-CM

## 2023-11-20 DIAGNOSIS — Z23 Encounter for immunization: Secondary | ICD-10-CM | POA: Diagnosis not present

## 2023-11-20 DIAGNOSIS — G4709 Other insomnia: Secondary | ICD-10-CM | POA: Diagnosis not present

## 2024-02-16 DIAGNOSIS — E669 Obesity, unspecified: Secondary | ICD-10-CM | POA: Diagnosis not present

## 2024-02-16 DIAGNOSIS — E559 Vitamin D deficiency, unspecified: Secondary | ICD-10-CM | POA: Diagnosis not present

## 2024-02-16 DIAGNOSIS — Z6829 Body mass index (BMI) 29.0-29.9, adult: Secondary | ICD-10-CM | POA: Diagnosis not present

## 2024-02-16 DIAGNOSIS — A6004 Herpesviral vulvovaginitis: Secondary | ICD-10-CM | POA: Diagnosis not present

## 2024-03-17 ENCOUNTER — Encounter: Admitting: Obstetrics and Gynecology

## 2024-03-17 NOTE — Progress Notes (Deleted)
 48 y.o. G70P1001 Married {RACE/ETHNICITY:22866} female here for annual exam.    PCP: Vanita Gens, MD (Inactive)   No LMP recorded. (Menstrual status: IUD).           Sexually active: Yes.    The current method of family planning is IUD.    Menopausal hormone therapy:  n/a Exercising: {yes no:314532}  {types:19826} Smoker:  no  OB History  Gravida Para Term Preterm AB Living  1 1 1   1   SAB IAB Ectopic Multiple Live Births      1    # Outcome Date GA Lbr Len/2nd Weight Sex Type Anes PTL Lv  1 Term 11/14/06    F Vag-Spont   LIV     HEALTH MAINTENANCE: Last 2 paps:  09/06/19 neg HR HPV neg History of abnormal Pap or positive HPV:  no Mammogram:   11/13/21 Breast Density Cat B, BIRADS Cat 1 neg  Colonoscopy:  n/a Bone Density:  n/a  Result  n/a   Immunization History  Administered Date(s) Administered   H1N1 09/14/2008   HPV 9-valent 09/06/2019   Influenza Inj Mdck Quad Pf 08/06/2019   Influenza Whole 08/15/2008   Influenza,inj,Quad PF,6+ Mos 09/12/2020   Influenza-Unspecified 08/08/2012, 07/17/2016   PFIZER(Purple Top)SARS-COV-2 Vaccination 01/06/2020, 01/27/2020, 09/12/2020   Tdap 11/10/2012      reports that she has never smoked. She has never used smokeless tobacco. She reports current alcohol use. She reports that she does not use drugs.  Past Medical History:  Diagnosis Date   ADD (attention deficit disorder)    Anxiety    Hypercholesterolemia    Positive PPD    STD (sexually transmitted disease)    Hx HSV II   Vitamin D deficiency     Past Surgical History:  Procedure Laterality Date   labial reduction     vulvar surgery   LIPOSUCTION      Current Outpatient Medications  Medication Sig Dispense Refill   ADZENYS XR-ODT 18.8 MG TBED Take 2 tablets by mouth every morning.     albuterol  (VENTOLIN  HFA) 108 (90 Base) MCG/ACT inhaler INHALE 2 PUFFS INTO THE LUNGS EVERY 6 HOURS AS NEEDED FOR WHEEZING OR SHORTNESS OF BREATH 6.7 g 1   Semaglutide-Weight  Management (WEGOVY [place]) Inject into the skin.     No current facility-administered medications for this visit.    ALLERGIES: Patient has no known allergies.  Family History  Problem Relation Age of Onset   Diabetes Father    Heart disease Father    Healthy Mother    Hyperlipidemia Mother    Cancer Maternal Uncle 47       Dec Glyoblastoma age 77   Hyperlipidemia Maternal Grandmother    Healthy Other        Sibling   Hypertension Other     Review of Systems  PHYSICAL EXAM:  There were no vitals taken for this visit.    General appearance: alert, cooperative and appears stated age Head: normocephalic, without obvious abnormality, atraumatic Neck: no adenopathy, supple, symmetrical, trachea midline and thyroid normal to inspection and palpation Lungs: clear to auscultation bilaterally Breasts: normal appearance, no masses or tenderness, No nipple retraction or dimpling, No nipple discharge or bleeding, No axillary adenopathy Heart: regular rate and rhythm Abdomen: soft, non-tender; no masses, no organomegaly Extremities: extremities normal, atraumatic, no cyanosis or edema Skin: skin color, texture, turgor normal. No rashes or lesions Lymph nodes: cervical, supraclavicular, and axillary nodes normal. Neurologic: grossly normal  Pelvic: External  genitalia:  no lesions              No abnormal inguinal nodes palpated.              Urethra:  normal appearing urethra with no masses, tenderness or lesions              Bartholins and Skenes: normal                 Vagina: normal appearing vagina with normal color and discharge, no lesions              Cervix: no lesions              Pap taken: {yes no:314532} Bimanual Exam:  Uterus:  normal size, contour, position, consistency, mobility, non-tender              Adnexa: no mass, fullness, tenderness              Rectal exam: {yes no:314532}.  Confirms.              Anus:  normal sphincter tone, no lesions  Chaperone was present  for exam:  {BSCHAPERONE:31226::"Emily F, CMA"}  ASSESSMENT: Well woman visit with gynecologic exam.  PHQ-9: ***  ***  PLAN: Mammogram screening discussed. Self breast awareness reviewed. Pap and HRV collected:  {yes no:314532} Guidelines for Calcium, Vitamin D, regular exercise program including cardiovascular and weight bearing exercise. Medication refills:  *** {LABS (Optional):23779} Follow up:  ***    Additional counseling given.  {yes [id number]. ***  total time was spent for this patient encounter, including preparation, face-to-face counseling with the patient, coordination of care, and documentation of the encounter in addition to doing the well woman visit with gynecologic exam.       .

## 2024-04-29 ENCOUNTER — Encounter: Admitting: Obstetrics and Gynecology

## 2024-09-27 NOTE — Progress Notes (Signed)
 Jackie Davis                                          MRN: [id number]   09/27/2024   The [facility] Quality Team Specialist reviewed this patient medical record for the purposes of chart review for care gap closure. The following were reviewed: chart review for care gap closure-colorectal cancer screening.    [facility] Quality Team
